# Patient Record
Sex: Female | Born: 1970 | Race: White | Hispanic: No | Smoking: Current every day smoker
Health system: Southern US, Community
[De-identification: ages and names within clinical notes are randomized; demographics above are authoritative.]

## PROBLEM LIST (undated history)

## (undated) DIAGNOSIS — G43909 Migraine, unspecified, not intractable, without status migrainosus: Secondary | ICD-10-CM

## (undated) DIAGNOSIS — E119 Type 2 diabetes mellitus without complications: Secondary | ICD-10-CM

## (undated) DIAGNOSIS — E7800 Pure hypercholesterolemia, unspecified: Secondary | ICD-10-CM

## (undated) DIAGNOSIS — F419 Anxiety disorder, unspecified: Secondary | ICD-10-CM

## (undated) DIAGNOSIS — Z789 Other specified health status: Secondary | ICD-10-CM

## (undated) HISTORY — DX: Migraine, unspecified, not intractable, without status migrainosus: G43.909

---

## 1997-07-26 HISTORY — PX: TUBAL LIGATION: SHX77

## 2016-01-23 ENCOUNTER — Other Ambulatory Visit (HOSPITAL_COMMUNITY): Payer: Self-pay | Admitting: Family

## 2016-01-23 DIAGNOSIS — Z1231 Encounter for screening mammogram for malignant neoplasm of breast: Secondary | ICD-10-CM

## 2016-02-02 ENCOUNTER — Ambulatory Visit (HOSPITAL_COMMUNITY)
Admission: RE | Admit: 2016-02-02 | Discharge: 2016-02-02 | Disposition: A | Discharge status: Home / Self Care | Payer: Self-pay | Source: Ambulatory Visit | Attending: Family | Admitting: Family

## 2016-02-02 DIAGNOSIS — Z1231 Encounter for screening mammogram for malignant neoplasm of breast: Secondary | ICD-10-CM

## 2016-02-06 ENCOUNTER — Other Ambulatory Visit (HOSPITAL_COMMUNITY): Payer: Self-pay | Admitting: Family

## 2016-02-06 ENCOUNTER — Other Ambulatory Visit: Payer: Self-pay | Admitting: Family

## 2016-02-06 DIAGNOSIS — R928 Other abnormal and inconclusive findings on diagnostic imaging of breast: Secondary | ICD-10-CM

## 2016-02-24 ENCOUNTER — Encounter (HOSPITAL_COMMUNITY): Payer: Self-pay

## 2016-03-02 ENCOUNTER — Encounter (HOSPITAL_COMMUNITY): Payer: Self-pay

## 2016-03-09 ENCOUNTER — Ambulatory Visit (HOSPITAL_COMMUNITY)
Admission: RE | Admit: 2016-03-09 | Discharge: 2016-03-09 | Disposition: A | Discharge status: Home / Self Care | Payer: PRIVATE HEALTH INSURANCE | Source: Ambulatory Visit | Attending: Family | Admitting: Family

## 2016-03-09 ENCOUNTER — Encounter (HOSPITAL_COMMUNITY): Payer: PRIVATE HEALTH INSURANCE

## 2016-03-09 DIAGNOSIS — N63 Unspecified lump in breast: Secondary | ICD-10-CM | POA: Insufficient documentation

## 2016-03-09 DIAGNOSIS — R928 Other abnormal and inconclusive findings on diagnostic imaging of breast: Secondary | ICD-10-CM

## 2016-03-09 DIAGNOSIS — N6489 Other specified disorders of breast: Secondary | ICD-10-CM | POA: Diagnosis not present

## 2016-08-30 ENCOUNTER — Other Ambulatory Visit (HOSPITAL_COMMUNITY): Payer: Self-pay | Admitting: *Deleted

## 2016-08-30 DIAGNOSIS — Z09 Encounter for follow-up examination after completed treatment for conditions other than malignant neoplasm: Secondary | ICD-10-CM

## 2016-09-14 ENCOUNTER — Ambulatory Visit (HOSPITAL_COMMUNITY)
Admission: RE | Admit: 2016-09-14 | Discharge: 2016-09-14 | Disposition: A | Discharge status: Home / Self Care | Payer: PRIVATE HEALTH INSURANCE | Source: Ambulatory Visit | Attending: *Deleted | Admitting: *Deleted

## 2016-09-14 DIAGNOSIS — N6311 Unspecified lump in the right breast, upper outer quadrant: Secondary | ICD-10-CM | POA: Diagnosis not present

## 2016-09-14 DIAGNOSIS — Z09 Encounter for follow-up examination after completed treatment for conditions other than malignant neoplasm: Secondary | ICD-10-CM

## 2017-01-28 ENCOUNTER — Other Ambulatory Visit (HOSPITAL_COMMUNITY): Payer: Self-pay | Admitting: *Deleted

## 2017-01-28 DIAGNOSIS — IMO0002 Reserved for concepts with insufficient information to code with codable children: Secondary | ICD-10-CM

## 2017-01-28 DIAGNOSIS — R229 Localized swelling, mass and lump, unspecified: Principal | ICD-10-CM

## 2017-02-15 ENCOUNTER — Encounter (HOSPITAL_COMMUNITY): Payer: Self-pay

## 2017-03-01 ENCOUNTER — Encounter (HOSPITAL_COMMUNITY): Payer: Self-pay

## 2017-03-01 ENCOUNTER — Ambulatory Visit (HOSPITAL_COMMUNITY)
Admission: RE | Admit: 2017-03-01 | Discharge: 2017-03-01 | Disposition: A | Discharge status: Home / Self Care | Payer: PRIVATE HEALTH INSURANCE | Source: Ambulatory Visit | Attending: *Deleted | Admitting: *Deleted

## 2017-03-01 DIAGNOSIS — N6310 Unspecified lump in the right breast, unspecified quadrant: Secondary | ICD-10-CM | POA: Insufficient documentation

## 2017-03-01 DIAGNOSIS — R928 Other abnormal and inconclusive findings on diagnostic imaging of breast: Secondary | ICD-10-CM | POA: Insufficient documentation

## 2017-03-01 DIAGNOSIS — R229 Localized swelling, mass and lump, unspecified: Secondary | ICD-10-CM

## 2017-03-01 DIAGNOSIS — N6320 Unspecified lump in the left breast, unspecified quadrant: Secondary | ICD-10-CM | POA: Insufficient documentation

## 2017-03-01 DIAGNOSIS — IMO0002 Reserved for concepts with insufficient information to code with codable children: Secondary | ICD-10-CM

## 2017-09-01 ENCOUNTER — Other Ambulatory Visit (HOSPITAL_COMMUNITY): Payer: Self-pay | Admitting: *Deleted

## 2017-09-01 DIAGNOSIS — R229 Localized swelling, mass and lump, unspecified: Principal | ICD-10-CM

## 2017-09-01 DIAGNOSIS — IMO0002 Reserved for concepts with insufficient information to code with codable children: Secondary | ICD-10-CM

## 2017-09-01 DIAGNOSIS — Z09 Encounter for follow-up examination after completed treatment for conditions other than malignant neoplasm: Secondary | ICD-10-CM

## 2017-09-20 ENCOUNTER — Other Ambulatory Visit (HOSPITAL_COMMUNITY): Payer: Self-pay | Admitting: *Deleted

## 2017-09-20 ENCOUNTER — Ambulatory Visit (HOSPITAL_COMMUNITY)
Admission: RE | Admit: 2017-09-20 | Discharge: 2017-09-20 | Disposition: A | Discharge status: Home / Self Care | Payer: PRIVATE HEALTH INSURANCE | Source: Ambulatory Visit | Attending: *Deleted | Admitting: *Deleted

## 2017-09-20 DIAGNOSIS — IMO0002 Reserved for concepts with insufficient information to code with codable children: Secondary | ICD-10-CM

## 2017-09-20 DIAGNOSIS — R229 Localized swelling, mass and lump, unspecified: Principal | ICD-10-CM

## 2017-09-20 DIAGNOSIS — N6320 Unspecified lump in the left breast, unspecified quadrant: Secondary | ICD-10-CM

## 2017-09-20 DIAGNOSIS — Z09 Encounter for follow-up examination after completed treatment for conditions other than malignant neoplasm: Secondary | ICD-10-CM

## 2017-09-20 DIAGNOSIS — N6310 Unspecified lump in the right breast, unspecified quadrant: Secondary | ICD-10-CM | POA: Insufficient documentation

## 2017-09-20 HISTORY — DX: Unspecified lump in the left breast, unspecified quadrant: N63.20

## 2018-03-02 ENCOUNTER — Other Ambulatory Visit (HOSPITAL_COMMUNITY): Payer: Self-pay | Admitting: *Deleted

## 2018-03-02 DIAGNOSIS — N6320 Unspecified lump in the left breast, unspecified quadrant: Principal | ICD-10-CM

## 2018-03-02 DIAGNOSIS — N6310 Unspecified lump in the right breast, unspecified quadrant: Secondary | ICD-10-CM

## 2018-03-28 ENCOUNTER — Encounter (HOSPITAL_COMMUNITY): Payer: Self-pay

## 2018-03-28 ENCOUNTER — Ambulatory Visit (HOSPITAL_COMMUNITY): Payer: PRIVATE HEALTH INSURANCE

## 2018-03-28 ENCOUNTER — Ambulatory Visit (HOSPITAL_COMMUNITY): Admission: RE | Admit: 2018-03-28 | Payer: PRIVATE HEALTH INSURANCE | Source: Ambulatory Visit

## 2018-03-28 ENCOUNTER — Other Ambulatory Visit (HOSPITAL_COMMUNITY): Payer: Self-pay | Admitting: *Deleted

## 2018-03-28 DIAGNOSIS — N6320 Unspecified lump in the left breast, unspecified quadrant: Principal | ICD-10-CM

## 2018-03-28 DIAGNOSIS — N6310 Unspecified lump in the right breast, unspecified quadrant: Secondary | ICD-10-CM

## 2018-04-18 ENCOUNTER — Ambulatory Visit (HOSPITAL_COMMUNITY)
Admission: RE | Admit: 2018-04-18 | Discharge: 2018-04-18 | Disposition: A | Discharge status: Home / Self Care | Payer: PRIVATE HEALTH INSURANCE | Source: Ambulatory Visit | Attending: *Deleted | Admitting: *Deleted

## 2018-04-18 DIAGNOSIS — N6322 Unspecified lump in the left breast, upper inner quadrant: Secondary | ICD-10-CM | POA: Diagnosis not present

## 2018-04-18 DIAGNOSIS — N6320 Unspecified lump in the left breast, unspecified quadrant: Secondary | ICD-10-CM

## 2018-04-18 DIAGNOSIS — N6314 Unspecified lump in the right breast, lower inner quadrant: Secondary | ICD-10-CM | POA: Diagnosis not present

## 2018-04-18 DIAGNOSIS — N6310 Unspecified lump in the right breast, unspecified quadrant: Secondary | ICD-10-CM

## 2018-09-21 ENCOUNTER — Other Ambulatory Visit (HOSPITAL_COMMUNITY): Payer: Self-pay | Admitting: *Deleted

## 2018-09-21 DIAGNOSIS — Z09 Encounter for follow-up examination after completed treatment for conditions other than malignant neoplasm: Secondary | ICD-10-CM

## 2018-09-21 DIAGNOSIS — N6320 Unspecified lump in the left breast, unspecified quadrant: Secondary | ICD-10-CM

## 2018-10-03 ENCOUNTER — Ambulatory Visit (HOSPITAL_COMMUNITY)
Admission: RE | Admit: 2018-10-03 | Discharge: 2018-10-03 | Disposition: A | Discharge status: Home / Self Care | Payer: PRIVATE HEALTH INSURANCE | Source: Ambulatory Visit | Attending: *Deleted | Admitting: *Deleted

## 2018-10-03 ENCOUNTER — Encounter (HOSPITAL_COMMUNITY): Payer: Self-pay

## 2018-10-03 ENCOUNTER — Ambulatory Visit (HOSPITAL_COMMUNITY): Payer: PRIVATE HEALTH INSURANCE

## 2018-10-03 ENCOUNTER — Ambulatory Visit (HOSPITAL_COMMUNITY): Payer: Self-pay

## 2018-10-03 DIAGNOSIS — Z09 Encounter for follow-up examination after completed treatment for conditions other than malignant neoplasm: Secondary | ICD-10-CM

## 2018-10-03 DIAGNOSIS — N6320 Unspecified lump in the left breast, unspecified quadrant: Secondary | ICD-10-CM | POA: Insufficient documentation

## 2019-10-16 ENCOUNTER — Other Ambulatory Visit (HOSPITAL_COMMUNITY): Payer: Self-pay | Admitting: *Deleted

## 2019-10-16 DIAGNOSIS — R928 Other abnormal and inconclusive findings on diagnostic imaging of breast: Secondary | ICD-10-CM

## 2019-10-16 DIAGNOSIS — Z09 Encounter for follow-up examination after completed treatment for conditions other than malignant neoplasm: Secondary | ICD-10-CM

## 2019-10-30 ENCOUNTER — Ambulatory Visit (HOSPITAL_COMMUNITY)
Admission: RE | Admit: 2019-10-30 | Discharge: 2019-10-30 | Disposition: A | Discharge status: Home / Self Care | Payer: PRIVATE HEALTH INSURANCE | Source: Ambulatory Visit | Attending: *Deleted | Admitting: *Deleted

## 2019-10-30 ENCOUNTER — Other Ambulatory Visit: Payer: Self-pay

## 2019-10-30 DIAGNOSIS — Z09 Encounter for follow-up examination after completed treatment for conditions other than malignant neoplasm: Secondary | ICD-10-CM | POA: Diagnosis present

## 2019-10-30 DIAGNOSIS — R928 Other abnormal and inconclusive findings on diagnostic imaging of breast: Secondary | ICD-10-CM | POA: Diagnosis present

## 2019-10-30 LAB — HM MAMMOGRAPHY

## 2019-11-06 ENCOUNTER — Other Ambulatory Visit (HOSPITAL_COMMUNITY): Payer: Self-pay

## 2019-11-06 ENCOUNTER — Encounter (HOSPITAL_COMMUNITY): Payer: Self-pay

## 2020-12-08 ENCOUNTER — Other Ambulatory Visit: Payer: Self-pay

## 2020-12-08 ENCOUNTER — Inpatient Hospital Stay (HOSPITAL_COMMUNITY)
Admission: EM | Admit: 2020-12-08 | Discharge: 2020-12-12 | DRG: 641 | Disposition: A | Discharge status: Home / Self Care | Payer: Self-pay | Attending: Family Medicine | Admitting: Family Medicine

## 2020-12-08 ENCOUNTER — Emergency Department (HOSPITAL_COMMUNITY): Payer: Self-pay

## 2020-12-08 ENCOUNTER — Encounter (HOSPITAL_COMMUNITY): Payer: Self-pay | Admitting: *Deleted

## 2020-12-08 DIAGNOSIS — Y92009 Unspecified place in unspecified non-institutional (private) residence as the place of occurrence of the external cause: Secondary | ICD-10-CM

## 2020-12-08 DIAGNOSIS — Z841 Family history of disorders of kidney and ureter: Secondary | ICD-10-CM

## 2020-12-08 DIAGNOSIS — M6282 Rhabdomyolysis: Secondary | ICD-10-CM | POA: Diagnosis present

## 2020-12-08 DIAGNOSIS — Z20822 Contact with and (suspected) exposure to covid-19: Secondary | ICD-10-CM | POA: Diagnosis present

## 2020-12-08 DIAGNOSIS — M419 Scoliosis, unspecified: Secondary | ICD-10-CM | POA: Diagnosis present

## 2020-12-08 DIAGNOSIS — Z833 Family history of diabetes mellitus: Secondary | ICD-10-CM

## 2020-12-08 DIAGNOSIS — F17210 Nicotine dependence, cigarettes, uncomplicated: Secondary | ICD-10-CM | POA: Diagnosis present

## 2020-12-08 DIAGNOSIS — Z880 Allergy status to penicillin: Secondary | ICD-10-CM

## 2020-12-08 DIAGNOSIS — Z28310 Unvaccinated for covid-19: Secondary | ICD-10-CM

## 2020-12-08 DIAGNOSIS — E876 Hypokalemia: Principal | ICD-10-CM | POA: Diagnosis present

## 2020-12-08 DIAGNOSIS — W19XXXA Unspecified fall, initial encounter: Secondary | ICD-10-CM | POA: Diagnosis present

## 2020-12-08 DIAGNOSIS — Z720 Tobacco use: Secondary | ICD-10-CM | POA: Diagnosis present

## 2020-12-08 HISTORY — DX: Other specified health status: Z78.9

## 2020-12-08 LAB — CBC WITH DIFFERENTIAL/PLATELET
Abs Immature Granulocytes: 0.04 10*3/uL (ref 0.00–0.07)
Basophils Absolute: 0.1 10*3/uL (ref 0.0–0.1)
Basophils Relative: 0 %
Eosinophils Absolute: 0.4 10*3/uL (ref 0.0–0.5)
Eosinophils Relative: 3 %
HCT: 36.6 % (ref 36.0–46.0)
Hemoglobin: 11.9 g/dL — ABNORMAL LOW (ref 12.0–15.0)
Immature Granulocytes: 0 %
Lymphocytes Relative: 20 %
Lymphs Abs: 2.8 10*3/uL (ref 0.7–4.0)
MCH: 26.9 pg (ref 26.0–34.0)
MCHC: 32.5 g/dL (ref 30.0–36.0)
MCV: 82.6 fL (ref 80.0–100.0)
Monocytes Absolute: 0.8 10*3/uL (ref 0.1–1.0)
Monocytes Relative: 6 %
Neutro Abs: 10 10*3/uL — ABNORMAL HIGH (ref 1.7–7.7)
Neutrophils Relative %: 71 %
Platelets: 368 10*3/uL (ref 150–400)
RBC: 4.43 MIL/uL (ref 3.87–5.11)
RDW: 14.5 % (ref 11.5–15.5)
WBC: 14.1 10*3/uL — ABNORMAL HIGH (ref 4.0–10.5)
nRBC: 0 % (ref 0.0–0.2)

## 2020-12-08 LAB — COMPREHENSIVE METABOLIC PANEL
ALT: 37 U/L (ref 0–44)
AST: 75 U/L — ABNORMAL HIGH (ref 15–41)
Albumin: 3.4 g/dL — ABNORMAL LOW (ref 3.5–5.0)
Alkaline Phosphatase: 74 U/L (ref 38–126)
Anion gap: 11 (ref 5–15)
BUN: <5 mg/dL — ABNORMAL LOW (ref 6–20)
CO2: 34 mmol/L — ABNORMAL HIGH (ref 22–32)
Calcium: 9.4 mg/dL (ref 8.9–10.3)
Chloride: 90 mmol/L — ABNORMAL LOW (ref 98–111)
Creatinine, Ser: 0.88 mg/dL (ref 0.44–1.00)
GFR, Estimated: >60 mL/min (ref >60)
Glucose, Bld: 161 mg/dL — ABNORMAL HIGH (ref 70–99)
Potassium: <2 mmol/L — CL (ref 3.5–5.1)
Sodium: 135 mmol/L (ref 135–145)
Total Bilirubin: 0.7 mg/dL (ref 0.3–1.2)
Total Protein: 7 g/dL (ref 6.5–8.1)

## 2020-12-08 LAB — RESP PANEL BY RT-PCR (FLU A&B, COVID) ARPGX2
Influenza A by PCR: NEGATIVE
Influenza B by PCR: NEGATIVE
SARS Coronavirus 2 by RT PCR: NEGATIVE

## 2020-12-08 LAB — CK: Total CK: 4007 U/L — ABNORMAL HIGH (ref 38–234)

## 2020-12-08 LAB — TROPONIN I (HIGH SENSITIVITY)
Troponin I (High Sensitivity): 28 ng/L — ABNORMAL HIGH (ref <18)
Troponin I (High Sensitivity): 37 ng/L — ABNORMAL HIGH (ref <18)

## 2020-12-08 LAB — I-STAT BETA HCG BLOOD, ED (MC, WL, AP ONLY): I-stat hCG, quantitative: 8.3 m[IU]/mL — ABNORMAL HIGH (ref <5)

## 2020-12-08 LAB — MAGNESIUM: Magnesium: 1.8 mg/dL (ref 1.7–2.4)

## 2020-12-08 LAB — HCG, QUANTITATIVE, PREGNANCY: hCG, Beta Chain, Quant, S: 9 m[IU]/mL — ABNORMAL HIGH (ref <5)

## 2020-12-08 MED ORDER — SODIUM CHLORIDE 0.9 % IV BOLUS
1000.0000 mL | Freq: Once | INTRAVENOUS | Status: AC
  Administered 2020-12-08: 1000 mL via INTRAVENOUS

## 2020-12-08 MED ORDER — MAGNESIUM SULFATE 2 GM/50ML IV SOLN
2.0000 g | Freq: Once | INTRAVENOUS | Status: AC
  Administered 2020-12-08: 2 g via INTRAVENOUS
  Filled 2020-12-08: qty 50

## 2020-12-08 MED ORDER — POTASSIUM CHLORIDE CRYS ER 20 MEQ PO TBCR
40.0000 meq | EXTENDED_RELEASE_TABLET | Freq: Once | ORAL | Status: AC
  Administered 2020-12-08: 40 meq via ORAL
  Filled 2020-12-08: qty 2

## 2020-12-08 MED ORDER — POTASSIUM CHLORIDE 10 MEQ/100ML IV SOLN
10.0000 meq | INTRAVENOUS | Status: AC
  Administered 2020-12-08 – 2020-12-09 (×3): 10 meq via INTRAVENOUS
  Filled 2020-12-08 (×3): qty 100

## 2020-12-08 NOTE — ED Provider Notes (Signed)
[facility] EMERGENCY DEPARTMENT Provider Note   CSN: [id number] Arrival date & time: 12/08/20  1708     History Chief Complaint  Patient presents with  . Leg Pain    Alejandra Riley is a 50 y.o. female.  HPI   50 year old female presents to the emergency department today for evaluation of bilateral leg pain and decreased ability to move her bilateral hands.  States that she started having pain to the bilateral lower extremities about 10 days ago.  Pain located anterior and posteriorly and is worse with movement.  She denies any injuries.  She denies any numbness to the legs.  She further reports that she has been unable to move her fingers and her hands like she normally would.  She did not have any numbness.  She denies any significant neck pain.  She does report a history of scoliosis.  She denies any recent fevers, cough, chest pain, abdominal pain.  She denies any nausea or vomiting.  She does report that she has had intermittent diarrhea for the last couple months but none recently.  She denies any urinary symptoms.  She denies any drug use, she denies any alcohol use.  She does not take any medications.  History reviewed. No pertinent past medical history.  Patient Active Problem List   Diagnosis Date Noted  . Acute hypokalemia 12/08/2020    History reviewed. No pertinent surgical history.   OB History   No obstetric history on file.     History reviewed. No pertinent family history.  Social History   Tobacco Use  . Smoking status: Current Every Day Smoker    Types: Cigarettes  . Smokeless tobacco: Never Used  Substance Use Topics  . Alcohol use: Not Currently  . Drug use: Never    Home Medications Prior to Admission medications   Not on File    Allergies    Penicillins  Review of Systems   Review of Systems  Constitutional: Negative for fever.  HENT: Negative for ear pain and sore throat.   Eyes: Negative for visual disturbance.  Respiratory:  Negative for cough and shortness of breath.   Cardiovascular: Negative for chest pain.  Gastrointestinal: Positive for diarrhea. Negative for abdominal pain, constipation, nausea and vomiting.  Genitourinary: Negative for dysuria and hematuria.  Musculoskeletal: Positive for myalgias.  Skin: Negative for rash.  Neurological: Positive for weakness. Negative for numbness.  All other systems reviewed and are negative.   Physical Exam Updated Vital Signs BP 113/74   Pulse (!) 54   Temp 98.4 F (36.9 C) (Oral)   Resp 17   Ht 5\' 6"  (1.676 m)   Wt 73.5 kg   LMP 05/20/2017 Comment: tubal ligation  SpO2 99%   BMI 26.15 kg/m   Physical Exam Vitals and nursing note reviewed.  Constitutional:      General: She is not in acute distress.    Appearance: She is well-developed.  HENT:     Head: Normocephalic and atraumatic.  Eyes:     Conjunctiva/sclera: Conjunctivae normal.  Cardiovascular:     Rate and Rhythm: Normal rate and regular rhythm.     Heart sounds: Normal heart sounds. No murmur heard.   Pulmonary:     Effort: Pulmonary effort is normal. No respiratory distress.     Breath sounds: Normal breath sounds. No wheezing, rhonchi or rales.  Abdominal:     General: Bowel sounds are normal.     Palpations: Abdomen is soft.     Tenderness:  There is no abdominal tenderness. There is no guarding or rebound.  Musculoskeletal:     Cervical back: Neck supple.     Right lower leg: No edema.     Left lower leg: No edema.     Comments: Diffuse tenderness to the bilateral lower extremities.  There is no swelling or erythema noted.  DP pulses are strong bilaterally.  Skin:    General: Skin is warm and dry.  Neurological:     Mental Status: She is alert.     Comments: Cranial nerves II through XII intact.  Decreased grip strength bilaterally.  The remainder of the strength in the bilateral upper extremities is intact and sensation is intact.  There is some decrease strength in the  bilateral lower extremities as she has significant pain with movement of the legs.  This limits exam.  She has normal sensation.     ED Results / Procedures / Treatments   Labs (all labs ordered are listed, but only abnormal results are displayed) Labs Reviewed  COMPREHENSIVE METABOLIC PANEL - Abnormal; Notable for the following components:      Result Value   Potassium <2.0 (*)    Chloride 90 (*)    CO2 34 (*)    Glucose, Bld 161 (*)    BUN <5 (*)    Albumin 3.4 (*)    AST 75 (*)    All other components within normal limits  CBC WITH DIFFERENTIAL/PLATELET - Abnormal; Notable for the following components:   WBC 14.1 (*)    Hemoglobin 11.9 (*)    Neutro Abs 10.0 (*)    All other components within normal limits  CK - Abnormal; Notable for the following components:   Total CK 4,007 (*)    All other components within normal limits  HCG, QUANTITATIVE, PREGNANCY - Abnormal; Notable for the following components:   hCG, Beta Chain, Quant, S 9 (*)    All other components within normal limits  I-STAT BETA HCG BLOOD, ED ([facility], [facility], [facility] ONLY) - Abnormal; Notable for the following components:   I-stat hCG, quantitative 8.3 (*)    All other components within normal limits  TROPONIN I (HIGH SENSITIVITY) - Abnormal; Notable for the following components:   Troponin I (High Sensitivity) 28 (*)    All other components within normal limits  RESP PANEL BY RT-PCR (FLU A&B, COVID) ARPGX2  MAGNESIUM  RAPID URINE DRUG SCREEN, HOSP PERFORMED  I-STAT CHEM 8, ED    EKG None  Radiology DG Knee Complete 4 Views Left  Result Date: 12/08/2020 CLINICAL DATA:  Anterior knee pain after fall EXAM: LEFT KNEE - COMPLETE 4+ VIEW COMPARISON:  None. FINDINGS: No evidence of fracture, dislocation, or joint effusion. No evidence of arthropathy or other focal bone abnormality. Soft tissues are unremarkable. IMPRESSION: No acute osseous abnormality. Electronically Signed   By: Maudry Mayhew MD   On: 12/08/2020 21:13     Procedures Procedures   CRITICAL CARE Performed by: Karrie Meres   Total critical care time: 41 minutes  Critical care time was exclusive of separately billable procedures and treating other patients.  Critical care was necessary to treat or prevent imminent or life-threatening deterioration.  Critical care was time spent personally by me on the following activities: development of treatment plan with patient and/or surrogate as well as nursing, discussions with consultants, evaluation of patient's response to treatment, examination of patient, obtaining history from patient or surrogate, ordering and performing treatments and interventions, ordering and review  of laboratory studies, ordering and review of radiographic studies, pulse oximetry and re-evaluation of patient's condition.   Medications Ordered in ED Medications  potassium chloride 10 mEq in 100 mL IVPB (10 mEq Intravenous New Bag/Given 12/08/20 2148)  magnesium sulfate IVPB 2 g 50 mL (2 g Intravenous New Bag/Given 12/08/20 2148)  potassium chloride SA (KLOR-CON) CR tablet 40 mEq (40 mEq Oral Given 12/08/20 2149)  sodium chloride 0.9 % bolus 1,000 mL (1,000 mLs Intravenous New Bag/Given 12/08/20 2146)    ED Course  I have reviewed the triage vital signs and the nursing notes.  Pertinent labs & imaging results that were available during my care of the patient were reviewed by me and considered in my medical decision making (see chart for details).    MDM Rules/Calculators/A&P                          50 year old female presenting for evaluation of decreased strength and pain to the bilateral lower extremities.  Reviewed/interpreted labs CBC shows a mild leukocytosis, mild anemia CMP with hypokalemia with a potassium of less than 2, low chloride, elevated CO2 at 34, normal BUN/creatinine, mildly elevated AST, ALT and bilirubin normal CK is elevated greater than 4000 Magnesium negative Beta-hCG is marginally  positive, suspect that this is a false positive Beta quant marginally elevated - post menopausal Troponin marginally elevated  UDS pending on admission   Patient had a fall in the emergency department.  This was witnessed by myself.  She did not sustain any head trauma, she states she tripped over her flip-flop.  She injured her left knee.  She has some tenderness to palpation on exam.  X-ray does not show any evidence of fracture.  She is ambulatory following the fall.  Patient was given p.o. and IV potassium for critically low potassium.  She is also given IV fluids in the setting of her rhabdomyolysis.  This is nontraumatic.  Unclear cause of her hypokalemia at this time as her only report is mild intermittent diarrhea.  Will admit for further treatment  10:18 PM CONSULT with Dr. Carren Rang with hospitalist service who accepts patient for admission   Final Clinical Impression(s) / ED Diagnoses Final diagnoses:  Hypokalemia  Non-traumatic rhabdomyolysis    Rx / DC Orders ED Discharge Orders    None       Rayne Du 12/08/20 2222    Mancel Bale, MD 12/08/20 2353

## 2020-12-08 NOTE — ED Triage Notes (Signed)
Pt with bilateral upper leg pain since 11/30/20.  Pt with last 3 fingers feel numb to left hand x 4 days and last 2 fingers on right hand starting to feel numb.

## 2020-12-08 NOTE — ED Notes (Signed)
Critical potassium of less than 2.0 reported to edp Courtey Couture PA at this time. See orders

## 2020-12-08 NOTE — ED Notes (Signed)
Around 2020 pt was ambulating without assistance when she tripped over her flip flop and fell. She states she did not fall due to  Light headedness. Does have abrasion to left knee. edp witnessed fall.

## 2020-12-08 NOTE — ED Provider Notes (Signed)
Emergency Medicine Provider Triage Evaluation Note  Alejandra Riley , a 50 y.o. female  was evaluated in triage.  Pt complains of pain and weakness in her extremities.  About 1 week ago she states she started feeling pain in the bilateral upper legs.  Pain worsens with ambulation and when moving to standing from a sitting position.  Also reports mild weakness in the upper leg since her pain started.  Additionally began noticing bilateral elbow pain.  Now reports a feeling of decreased sensation and weakness in the forearms and hands bilaterally.  She states she feels that her left hand is weaker than her right.  She states that the third fourth and fifth digit of the left hand feel weak and she is having difficulty with flexion and extension.  She is beginning to feel the same sensation in the fourth and fifth digit of the right hand.  Physical Exam  BP 130/85 (BP Location: Right Arm)   Pulse 71   Temp 98.4 F (36.9 C) (Oral)   Resp 14   Ht 5\' 6"  (1.676 m)   Wt 73.5 kg   LMP 05/20/2017 Comment: tubal ligation  SpO2 99%   BMI 26.15 kg/m  Gen:   Awake, no distress   Resp:  Normal effort  MSK:   Diminished grip strength bilaterally.  Negative Tinel's sign. Other:    Medical Decision Making  Medically screening exam initiated at 5:54 PM.  Appropriate orders placed.  Krishika Bugge was informed that the remainder of the evaluation will be completed by another provider, this initial triage assessment does not replace that evaluation, and the importance of remaining in the ED until their evaluation is complete.    [name], PA-C 12/08/20 1756    12/10/20, MD 12/08/20 [phone]

## 2020-12-09 ENCOUNTER — Encounter (HOSPITAL_COMMUNITY): Payer: Self-pay | Admitting: Family Medicine

## 2020-12-09 DIAGNOSIS — E876 Hypokalemia: Principal | ICD-10-CM

## 2020-12-09 DIAGNOSIS — M6282 Rhabdomyolysis: Secondary | ICD-10-CM | POA: Diagnosis present

## 2020-12-09 LAB — CBC
HCT: 32.1 % — ABNORMAL LOW (ref 36.0–46.0)
Hemoglobin: 10.4 g/dL — ABNORMAL LOW (ref 12.0–15.0)
MCH: 26.5 pg (ref 26.0–34.0)
MCHC: 32.4 g/dL (ref 30.0–36.0)
MCV: 81.9 fL (ref 80.0–100.0)
Platelets: 318 10*3/uL (ref 150–400)
RBC: 3.92 MIL/uL (ref 3.87–5.11)
RDW: 14.7 % (ref 11.5–15.5)
WBC: 11.6 10*3/uL — ABNORMAL HIGH (ref 4.0–10.5)
nRBC: 0 % (ref 0.0–0.2)

## 2020-12-09 LAB — RAPID URINE DRUG SCREEN, HOSP PERFORMED
Amphetamines: NOT DETECTED
Barbiturates: NOT DETECTED
Benzodiazepines: NOT DETECTED
Cocaine: NOT DETECTED
Opiates: NOT DETECTED
Tetrahydrocannabinol: NOT DETECTED

## 2020-12-09 LAB — COMPREHENSIVE METABOLIC PANEL
ALT: 33 U/L (ref 0–44)
AST: 68 U/L — ABNORMAL HIGH (ref 15–41)
Albumin: 2.7 g/dL — ABNORMAL LOW (ref 3.5–5.0)
Alkaline Phosphatase: 63 U/L (ref 38–126)
Anion gap: 10 (ref 5–15)
BUN: <5 mg/dL — ABNORMAL LOW (ref 6–20)
CO2: 34 mmol/L — ABNORMAL HIGH (ref 22–32)
Calcium: 8.1 mg/dL — ABNORMAL LOW (ref 8.9–10.3)
Chloride: 97 mmol/L — ABNORMAL LOW (ref 98–111)
Creatinine, Ser: 0.69 mg/dL (ref 0.44–1.00)
GFR, Estimated: >60 mL/min (ref >60)
Glucose, Bld: 126 mg/dL — ABNORMAL HIGH (ref 70–99)
Potassium: <2 mmol/L — CL (ref 3.5–5.1)
Sodium: 141 mmol/L (ref 135–145)
Total Bilirubin: 0.7 mg/dL (ref 0.3–1.2)
Total Protein: 5.6 g/dL — ABNORMAL LOW (ref 6.5–8.1)

## 2020-12-09 LAB — NA AND K (SODIUM & POTASSIUM), RAND UR
Potassium Urine: 6 mmol/L
Sodium, Ur: 27 mmol/L

## 2020-12-09 LAB — HIV ANTIBODY (ROUTINE TESTING W REFLEX): HIV Screen 4th Generation wRfx: NONREACTIVE

## 2020-12-09 LAB — TSH: TSH: 1.869 u[IU]/mL (ref 0.350–4.500)

## 2020-12-09 LAB — URINALYSIS, ROUTINE W REFLEX MICROSCOPIC
Bilirubin Urine: NEGATIVE
Glucose, UA: NEGATIVE mg/dL
Hgb urine dipstick: NEGATIVE
Ketones, ur: NEGATIVE mg/dL
Leukocytes,Ua: NEGATIVE
Nitrite: NEGATIVE
Protein, ur: NEGATIVE mg/dL
Specific Gravity, Urine: 1.003 — ABNORMAL LOW (ref 1.005–1.030)
pH: 7 (ref 5.0–8.0)

## 2020-12-09 LAB — OSMOLALITY, URINE: Osmolality, Ur: 137 mOsm/kg — ABNORMAL LOW (ref 300–900)

## 2020-12-09 LAB — CORTISOL: Cortisol, Plasma: 5.4 ug/dL

## 2020-12-09 LAB — POTASSIUM: Potassium: <2 mmol/L — CL (ref 3.5–5.1)

## 2020-12-09 LAB — TROPONIN I (HIGH SENSITIVITY): Troponin I (High Sensitivity): 33 ng/L — ABNORMAL HIGH (ref <18)

## 2020-12-09 LAB — CK: Total CK: 3609 U/L — ABNORMAL HIGH (ref 38–234)

## 2020-12-09 LAB — MAGNESIUM: Magnesium: 2.1 mg/dL (ref 1.7–2.4)

## 2020-12-09 MED ORDER — OXYCODONE HCL 5 MG PO TABS: 5.0000 mg | ORAL_TABLET | ORAL | Status: DC | PRN

## 2020-12-09 MED ORDER — ACETAMINOPHEN 650 MG RE SUPP: 650.0000 mg | Freq: Four times a day (QID) | RECTAL | Status: DC | PRN

## 2020-12-09 MED ORDER — ONDANSETRON HCL 4 MG PO TABS: 4.0000 mg | ORAL_TABLET | Freq: Four times a day (QID) | ORAL | Status: DC | PRN

## 2020-12-09 MED ORDER — ACETAMINOPHEN 325 MG PO TABS
650.0000 mg | ORAL_TABLET | Freq: Four times a day (QID) | ORAL | Status: DC | PRN
  Administered 2020-12-10 – 2020-12-11 (×2): 650 mg via ORAL
  Filled 2020-12-09 (×2): qty 2

## 2020-12-09 MED ORDER — MAGNESIUM SULFATE 2 GM/50ML IV SOLN
2.0000 g | Freq: Once | INTRAVENOUS | Status: AC
  Administered 2020-12-09: 2 g via INTRAVENOUS
  Filled 2020-12-09: qty 50

## 2020-12-09 MED ORDER — SODIUM CHLORIDE 0.9 % IV SOLN: INTRAVENOUS | Status: DC

## 2020-12-09 MED ORDER — POTASSIUM CHLORIDE 10 MEQ/100ML IV SOLN
10.0000 meq | INTRAVENOUS | Status: AC
  Administered 2020-12-09 (×4): 10 meq via INTRAVENOUS
  Filled 2020-12-09 (×4): qty 100

## 2020-12-09 MED ORDER — NICOTINE 21 MG/24HR TD PT24
21.0000 mg | MEDICATED_PATCH | Freq: Every day | TRANSDERMAL | Status: DC
  Administered 2020-12-09: 21 mg via TRANSDERMAL
  Filled 2020-12-09 (×2): qty 1

## 2020-12-09 MED ORDER — ONDANSETRON HCL 4 MG/2ML IJ SOLN: 4.0000 mg | Freq: Four times a day (QID) | INTRAMUSCULAR | Status: DC | PRN

## 2020-12-09 MED ORDER — POTASSIUM CHLORIDE CRYS ER 20 MEQ PO TBCR
40.0000 meq | EXTENDED_RELEASE_TABLET | ORAL | Status: AC
  Administered 2020-12-09 – 2020-12-10 (×3): 40 meq via ORAL
  Filled 2020-12-09 (×3): qty 2

## 2020-12-09 MED ORDER — POTASSIUM CHLORIDE IN NACL 40-0.9 MEQ/L-% IV SOLN: INTRAVENOUS | Status: DC

## 2020-12-09 MED ORDER — HEPARIN SODIUM (PORCINE) 5000 UNIT/ML IJ SOLN
5000.0000 [IU] | Freq: Three times a day (TID) | INTRAMUSCULAR | Status: DC
  Administered 2020-12-09 – 2020-12-11 (×9): 5000 [IU] via SUBCUTANEOUS
  Filled 2020-12-09 (×9): qty 1

## 2020-12-09 MED ORDER — POTASSIUM CHLORIDE CRYS ER 20 MEQ PO TBCR
40.0000 meq | EXTENDED_RELEASE_TABLET | ORAL | Status: AC
  Administered 2020-12-09 (×2): 40 meq via ORAL
  Filled 2020-12-09 (×2): qty 2

## 2020-12-09 NOTE — H&P (Signed)
TRH H&P    Patient Demographics:    Alejandra Riley, is a 50 y.o. female  MRN: [id number]  DOB - Dec 22, 1970  Admit Date - 12/08/2020  Referring MD/NP/PA: Samson Frederic  Outpatient Primary MD for the patient is House, Eugenio Hoes, FNP  Patient coming from: Home  Chief complaint- weakness   HPI:    Alejandra Riley  is a 50 y.o. female, with no known medical history presents the ED with a chief complaint of weakness.  Patient reports that she has had weakness in her hands and legs, contractures in her hands, muscle cramping in her legs, for 4 days.  It is worse in the morning.  Its present in both arms but worse on the left than the right.  She describes the pain in her legs as an aching, like a muscle ache.  Pain starts in her knee and shoots up towards her hip.  It is not radiating from her back.  She reports that this pain actually started before the weakness, approximately 8 days ago.  She reports that she has been treated for hypokalemia in the past but never hospitalized for it.  She has no personal history of kidney disease, reports that her mother had kidney disease.  She has no personal history of any endocrine disorder, but reports her mother did have diabetes.  Patient reports urinary frequency.  She denies any concerns with her blood pressure.  She denies any nausea, vomiting, diarrhea.  She does report that every other day she had 1 loose stool, and then on the office today she has 1 formed stool.  In review of systems she does admit that she was dizzy once 2 to 3 months ago, it was not accompanied by palpitations or chest pain.  Patient does smoke, does not use illicit drugs, does not drink alcohol, is not vaccinated for COVID, patient is full code.  In the ED Temperature 98.4, heart rate 58-71, respiratory rate 14-24, blood pressure 129/78, satting at 98% Initial troponin is 28, and 37 Leukocytosis with a white  blood cell count of 14.1, chemistry shows a potassium of less than 2, chloride 90, bicarb 34, albumin 3.4, AST 75 Initial EKG is of poor quality, repeat pending 40 mEq of potassium given p.o., 3 g of potassium given IV in the ED, magnesium and IV fluids also given in the ED Patient is found to have a CPK of 4007 Admission requested for further work-up of rhabdo and hypokalemia    Review of systems:    In addition to the HPI above,  No Fever-chills, No Headache, No changes with Vision or hearing, No problems swallowing food or Liquids, No Chest pain, Cough or Shortness of Breath, No Abdominal pain, No Nausea or Vomiting, bowel movements are regular, No Blood in stool or Urine, No dysuria, No new skin rashes or bruises, No new weakness, tingling, numbness in any extremity, No recent weight gain or loss, No polyuria, polydypsia or polyphagia, No significant Mental Stressors.  All other systems reviewed and are negative.    Past History  of the following :    Past Medical History:  Diagnosis Date  . Medical history non-contributory       Past Surgical History:  Procedure Laterality Date  . TUBAL LIGATION        Social History:      Social History   Tobacco Use  . Smoking status: Current Every Day Smoker    Packs/day: 1.50    Types: Cigarettes  . Smokeless tobacco: Never Used  Substance Use Topics  . Alcohol use: Not Currently       Family History :    History reviewed. No pertinent family history. Mother had kidney disease and diabetes mellitus type 2   Home Medications:   Prior to Admission medications   Not on File     Allergies:     Allergies  Allergen Reactions  . Amoxicillin Hives  . Penicillins      Physical Exam:   Vitals  Blood pressure 121/76, pulse (!) 55, temperature 98 F (36.7 C), temperature source Oral, resp. rate 18, height  (1.676 m), weight 73.5 kg, last menstrual period 05/20/2017, SpO2 97 %.  1.  General: Patient  lying supine in bed in no acute distress  2. Psychiatric: Mood and behavior normal for situation, memory intact, alert and oriented x3  3. Neurologic: Speech and language are normal, face is symmetric, moves all 4 extremities voluntarily, no focal deficits on limited exam, patient does have generalized weakness  4. HEENMT:  Head is atraumatic, normocephalic, pupils are reactive to light, neck is supple, trachea is midline, mucous membranes are moist  5. Respiratory : Lungs are clear to auscultation bilaterally without wheezes, rhonchi, rales, no cyanosis, no clubbing  6. Cardiovascular : Heart rate is normal, rhythm is regular, no murmurs rubs or gallops, peripheral edema present  7. Gastrointestinal:  Abdomen is soft, nondistended, nontender to palpation, bowel sounds normal  8. Skin:  Skin is warm dry and intact without acute lesion on limited exam  9.Musculoskeletal:   no calf tenderness, no acute deformity, no pitting edema   Data Review:    CBC Recent Labs  Lab 12/08/20 1812  WBC 14.1*  HGB 11.9*  HCT 36.6  PLT 368  MCV 82.6  MCH 26.9  MCHC 32.5  RDW 14.5  LYMPHSABS 2.8  MONOABS 0.8  EOSABS 0.4  BASOSABS 0.1   ------------------------------------------------------------------------------------------------------------------  Results for orders placed or performed during the hospital encounter of 12/08/20 (from the past 48 hour(s))  Comprehensive metabolic panel     Status: Abnormal   Collection Time: 12/08/20  6:12 PM  Result Value Ref Range   Sodium 135 135 - 145 mmol/L   Potassium <2.0 (LL) 3.5 - 5.1 mmol/L    Comment: CRITICAL RESULT CALLED TO, READ BACK BY AND VERIFIED WITH: [place],K ON 12/08/20 AT 2055 BY LOY,C    Chloride 90 (L) 98 - 111 mmol/L   CO2 34 (H) 22 - 32 mmol/L   Glucose, Bld 161 (H) 70 - 99 mg/dL    Comment: Glucose reference range applies only to samples taken after fasting for at least 8 hours.   BUN <5 (L) 6 - 20 mg/dL    Creatinine, Ser 0.98 0.44 - 1.00 mg/dL   Calcium 9.4 8.9 - 11.9 mg/dL   Total Protein 7.0 6.5 - 8.1 g/dL   Albumin 3.4 (L) 3.5 - 5.0 g/dL   AST 75 (H) 15 - 41 U/L   ALT 37 0 - 44 U/L   Alkaline Phosphatase 74  38 - 126 U/L   Total Bilirubin 0.7 0.3 - 1.2 mg/dL   GFR, Estimated >16 >38 mL/min    Comment: (NOTE) Calculated using the CKD-EPI Creatinine Equation (2021)    Anion gap 11 5 - 15    Comment: Performed at [facility], [address] [place], [place] [address]  CBC with Differential     Status: Abnormal   Collection Time: 12/08/20  6:12 PM  Result Value Ref Range   WBC 14.1 (H) 4.0 - 10.5 K/uL   RBC 4.43 3.87 - 5.11 MIL/uL   Hemoglobin 11.9 (L) 12.0 - 15.0 g/dL   HCT 93.5 70.1 - 77.9 %   MCV 82.6 80.0 - 100.0 fL   MCH 26.9 26.0 - 34.0 pg   MCHC 32.5 30.0 - 36.0 g/dL   RDW 39.0 30.0 - 92.3 %   Platelets 368 150 - 400 K/uL   nRBC 0.0 0.0 - 0.2 %   Neutrophils Relative % 71 %   Neutro Abs 10.0 (H) 1.7 - 7.7 K/uL   Lymphocytes Relative 20 %   Lymphs Abs 2.8 0.7 - 4.0 K/uL   Monocytes Relative 6 %   Monocytes Absolute 0.8 0.1 - 1.0 K/uL   Eosinophils Relative 3 %   Eosinophils Absolute 0.4 0.0 - 0.5 K/uL   Basophils Relative 0 %   Basophils Absolute 0.1 0.0 - 0.1 K/uL   Immature Granulocytes 0 %   Abs Immature Granulocytes 0.04 0.00 - 0.07 K/uL    Comment: Performed at [facility], [address] [place], [place] [address]  CK     Status: Abnormal   Collection Time: 12/08/20  8:30 PM  Result Value Ref Range   Total CK 4,007 (H) 38 - 234 U/L    Comment: Performed at [facility], [address], [place], [place] [address]  Magnesium     Status: None   Collection Time: 12/08/20  8:30 PM  Result Value Ref Range   Magnesium 1.8 1.7 - 2.4 mg/dL    Comment: Performed at [facility], [address], [place], [place] [address]  I-Stat Beta hCG blood, ED ([facility], [facility], [facility] only)     Status: Abnormal   Collection Time: 12/08/20  8:33 PM  Result Value Ref Range   I-stat hCG,  quantitative 8.3 (H) <5 mIU/mL   Comment 3            Comment:   GEST. AGE      CONC.  (mIU/mL)   <=1 WEEK        5 - 50     2 WEEKS       50 - 500     3 WEEKS       100 - 10,000     4 WEEKS     1,000 - 30,000        FEMALE AND NON-PREGNANT FEMALE:     LESS THAN 5 mIU/mL   Troponin I (High Sensitivity)     Status: Abnormal   Collection Time: 12/08/20  9:07 PM  Result Value Ref Range   Troponin I (High Sensitivity) 28 (H) <18 ng/L    Comment: (NOTE) Elevated high sensitivity troponin I (hsTnI) values and significant  changes across serial measurements may suggest ACS but many other  chronic and acute conditions are known to elevate hsTnI results.  Refer to the "Links" section for chest pain algorithms and additional  guidance. Performed at [facility], [address], [place], [place] [address]  hCG, quantitative, pregnancy     Status: Abnormal   Collection Time: 12/08/20  9:08 PM  Result Value Ref Range   hCG, Beta Chain, Quant, S 9 (H) <5 mIU/mL    Comment:          GEST. AGE      CONC.  (mIU/mL)   <=1 WEEK        5 - 50     2 WEEKS       50 - 500     3 WEEKS       100 - 10,000     4 WEEKS     1,000 - 30,000     5 WEEKS     3,500 - 115,000   6-8 WEEKS     12,000 - 270,000    12 WEEKS     15,000 - 220,000        FEMALE AND NON-PREGNANT FEMALE:     LESS THAN 5 mIU/mL Performed at [facility], [address] [place], [place] [address]   Resp Panel by RT-PCR (Flu A&B, Covid) Nasopharyngeal Swab     Status: None   Collection Time: 12/08/20  9:54 PM   Specimen: Nasopharyngeal Swab; Nasopharyngeal(NP) swabs in vial transport medium  Result Value Ref Range   SARS Coronavirus 2 by RT PCR NEGATIVE NEGATIVE    Comment: (NOTE) SARS-CoV-2 target nucleic acids are NOT DETECTED.  The SARS-CoV-2 RNA is generally detectable in upper respiratory specimens during the acute phase of infection. The lowest concentration of SARS-CoV-2 viral copies this assay can detect is 138 copies/mL. A  negative result does not preclude SARS-Cov-2 infection and should not be used as the sole basis for treatment or other patient management decisions. A negative result may occur with  improper specimen collection/handling, submission of specimen other than nasopharyngeal swab, presence of viral mutation(s) within the areas targeted by this assay, and inadequate number of viral copies(<138 copies/mL). A negative result must be combined with clinical observations, patient history, and epidemiological information. The expected result is Negative.  Fact Sheet for Patients:  BloggerCourse.comhttps://www.fda.gov/media/152166/download  Fact Sheet for Healthcare Providers:  SeriousBroker.ithttps://www.fda.gov/media/152162/download  This test is no t yet approved or cleared by the [place] FDA and  has been authorized for detection and/or diagnosis of SARS-CoV-2 by FDA under an Emergency Use Authorization (EUA). This EUA will remain  in effect (meaning this test can be used) for the duration of the COVID-19 declaration under Section 564(b)(1) of the Act, 21 U.S.C.section 360bbb-3(b)(1), unless the authorization is terminated  or revoked sooner.       Influenza A by PCR NEGATIVE NEGATIVE   Influenza B by PCR NEGATIVE NEGATIVE    Comment: (NOTE) The Xpert Xpress SARS-CoV-2/FLU/RSV plus assay is intended as an aid in the diagnosis of influenza from Nasopharyngeal swab specimens and should not be used as a sole basis for treatment. Nasal washings and aspirates are unacceptable for Xpert Xpress SARS-CoV-2/FLU/RSV testing.  Fact Sheet for Patients: BloggerCourse.comhttps://www.fda.gov/media/152166/download  Fact Sheet for Healthcare Providers: SeriousBroker.ithttps://www.fda.gov/media/152162/download  This test is not yet approved or cleared by the [place] FDA and has been authorized for detection and/or diagnosis of SARS-CoV-2 by FDA under an Emergency Use Authorization (EUA). This EUA will remain in effect (meaning this test can be used)  for the duration of the COVID-19 declaration under Section 564(b)(1) of the Act, 21 U.S.C. section 360bbb-3(b)(1), unless the authorization is terminated or revoked.  Performed at [facility], [address] [place], [place] [address]  Troponin I (High Sensitivity)     Status: Abnormal   Collection Time: 12/08/20 10:53 PM  Result Value Ref Range   Troponin I (High Sensitivity) 37 (H) <18 ng/L    Comment: (NOTE) Elevated high sensitivity troponin I (hsTnI) values and significant  changes across serial measurements may suggest ACS but many other  chronic and acute conditions are known to elevate hsTnI results.  Refer to the "Links" section for chest pain algorithms and additional  guidance. Performed at [facility], [address], [place], [place] [address]     Chemistries  Recent Labs  Lab 12/08/20 1812 12/08/20 2030  NA 135  --   K <2.0*  --   CL 90*  --   CO2 34*  --   GLUCOSE 161*  --   BUN <5*  --   CREATININE 0.88  --   CALCIUM 9.4  --   MG  --  1.8  AST 75*  --   ALT 37  --   ALKPHOS 74  --   BILITOT 0.7  --    ------------------------------------------------------------------------------------------------------------------  ------------------------------------------------------------------------------------------------------------------ GFR: Estimated Creatinine Clearance: 79.4 mL/min (by C-G formula based on SCr of 0.88 mg/dL). Liver Function Tests: Recent Labs  Lab 12/08/20 1812  AST 75*  ALT 37  ALKPHOS 74  BILITOT 0.7  PROT 7.0  ALBUMIN 3.4*   No results for input(s): LIPASE, AMYLASE in the last 168 hours. No results for input(s): AMMONIA in the last 168 hours. Coagulation Profile: No results for input(s): INR, PROTIME in the last 168 hours. Cardiac Enzymes: Recent Labs  Lab 12/08/20 2030  CKTOTAL 4,007*   BNP (last 3 results) No results for input(s): PROBNP in the last 8760 hours. HbA1C: No results for input(s): HGBA1C in the last 72  hours. CBG: No results for input(s): GLUCAP in the last 168 hours. Lipid Profile: No results for input(s): CHOL, HDL, LDLCALC, TRIG, CHOLHDL, LDLDIRECT in the last 72 hours. Thyroid Function Tests: No results for input(s): TSH, T4TOTAL, FREET4, T3FREE, THYROIDAB in the last 72 hours. Anemia Panel: No results for input(s): VITAMINB12, FOLATE, FERRITIN, TIBC, IRON, RETICCTPCT in the last 72 hours.  --------------------------------------------------------------------------------------------------------------- Urine analysis: No results found for: COLORURINE, APPEARANCEUR, LABSPEC, PHURINE, GLUCOSEU, HGBUR, BILIRUBINUR, KETONESUR, PROTEINUR, UROBILINOGEN, NITRITE, LEUKOCYTESUR    Imaging Results:    DG Knee Complete 4 Views Left  Result Date: 12/08/2020 CLINICAL DATA:  Anterior knee pain after fall EXAM: LEFT KNEE - COMPLETE 4+ VIEW COMPARISON:  None. FINDINGS: No evidence of fracture, dislocation, or joint effusion. No evidence of arthropathy or other focal bone abnormality. Soft tissues are unremarkable. IMPRESSION: No acute osseous abnormality. Electronically Signed   By: Maudry Mayhew MD   On: 12/08/2020 21:13    Repeat EKG pending   Assessment & Plan:    Active Problems:   Acute hypokalemia   1. Severe hypokalemia 1. Replace, recheck, check magnesium, monitor on telemetry 2. Rhabdomyolysis 1. CPK for 4007 2. Continue aggressive hydration 3. Etiology unclear, no periods of immobility 4. UDS pending 5. Trend in the a.m. 3. Leukocytosis 1. Acute phase reactant? 2. Infectious symptoms 3. No respiratory symptoms 4. UA pending 5. Continue to trend in a.m. 4. Tobacco use disorder 1. Counseled on cessation 2. Nicotine patch ordered 5. Elevated troponin 1. Minimally elevated in the 20s and 30s, no chest pain 2. Continue to trend 3. Monitor on telemetry 4. EKG pending 6.    DVT Prophylaxis-   Heparin- SCDs   AM Labs Ordered, also please  review Full Orders  Family  Communication: No family at bedside code Status: Full  Admission status: Inpatient :The appropriate admission status for this patient is INPATIENT. Inpatient status is judged to be reasonable and necessary in order to provide the required intensity of service to ensure the patient's safety. The patient's presenting symptoms, physical exam findings, and initial radiographic and laboratory data in the context of their chronic comorbidities is felt to place them at high risk for further clinical deterioration. Furthermore, it is not anticipated that the patient will be medically stable for discharge from the hospital within 2 midnights of admission. The following factors support the admission status of inpatient.     The patient's presenting symptoms include weakness  the worrisome physical exam findings include sinus arrhythmia on monitor  The chronic co-morbidities include no known medical history     * I certify that at the point of admission it is my clinical judgment that the patient will require inpatient hospital care spanning beyond 2 midnights from the point of admission due to high intensity of service, high risk for further deterioration and high frequency of surveillance required.*  Time spent in minutes : 65    B Zierle-Ghosh do

## 2020-12-09 NOTE — Progress Notes (Signed)
Patient seen and examined.  Admitted after midnight secondary to generalized weakness and mechanical fall resulting in mild traumatic rhabdomyolysis.  Found with severe hypokalemic process.  Patient reports no nausea, no vomiting, no diarrhea and good oral intake prior to admission.  Please refer to H&P written by Dr. Carren Rang on 12/09/20 for further info/details on admission.   Plan: -will continue IVF's, but will add potassium to her fluids  -will provide oral potassium repletion and magnesium  -will check cortisol level and aldosterone/renin ratio -continue supportive care and follow clinical response -no chest pain; will continue telemetry monitoring   [name] MD [phone]

## 2020-12-09 NOTE — Progress Notes (Signed)
Lab called with a critical potassium level of less than 2.0. MD Zierle-Ghosh notified.

## 2020-12-09 NOTE — Progress Notes (Signed)
Lab called critical lab potassium less than 2.0. MD Gwenlyn Perking made aware. New orders given.

## 2020-12-10 DIAGNOSIS — Z720 Tobacco use: Secondary | ICD-10-CM | POA: Diagnosis present

## 2020-12-10 DIAGNOSIS — M6282 Rhabdomyolysis: Secondary | ICD-10-CM | POA: Insufficient documentation

## 2020-12-10 LAB — MAGNESIUM: Magnesium: 1.9 mg/dL (ref 1.7–2.4)

## 2020-12-10 LAB — BASIC METABOLIC PANEL
Anion gap: 8 (ref 5–15)
BUN: <5 mg/dL — ABNORMAL LOW (ref 6–20)
CO2: 31 mmol/L (ref 22–32)
Calcium: 7.4 mg/dL — ABNORMAL LOW (ref 8.9–10.3)
Chloride: 105 mmol/L (ref 98–111)
Creatinine, Ser: 0.6 mg/dL (ref 0.44–1.00)
GFR, Estimated: >60 mL/min (ref >60)
Glucose, Bld: 110 mg/dL — ABNORMAL HIGH (ref 70–99)
Potassium: 3.3 mmol/L — ABNORMAL LOW (ref 3.5–5.1)
Sodium: 144 mmol/L (ref 135–145)

## 2020-12-10 LAB — ALDOSTERONE + RENIN ACTIVITY W/ RATIO

## 2020-12-10 MED ORDER — POTASSIUM CHLORIDE CRYS ER 20 MEQ PO TBCR
40.0000 meq | EXTENDED_RELEASE_TABLET | ORAL | Status: AC
  Administered 2020-12-10 (×2): 40 meq via ORAL
  Filled 2020-12-10 (×2): qty 2

## 2020-12-10 MED ORDER — COSYNTROPIN 0.25 MG IJ SOLR
0.2500 mg | Freq: Once | INTRAMUSCULAR | Status: AC
  Administered 2020-12-11: 0.25 mg via INTRAVENOUS
  Filled 2020-12-10: qty 0.25

## 2020-12-10 MED ORDER — NICOTINE 14 MG/24HR TD PT24
14.0000 mg | MEDICATED_PATCH | Freq: Every day | TRANSDERMAL | Status: DC
  Administered 2020-12-10 – 2020-12-12 (×3): 14 mg via TRANSDERMAL
  Filled 2020-12-10 (×3): qty 1

## 2020-12-10 NOTE — Progress Notes (Signed)
Patient Demographics:    Alejandra Riley, is a 50 y.o. female, DOB - 09/12/1970, KDT:[id number]  Admit date - 12/08/2020   Admitting Physician Lilyan Gilford, DO  Outpatient Primary MD for the patient is House, Alejandra Hoes, FNP  LOS - 2   Chief Complaint  Patient presents with  . Leg Pain        Subjective:    [name] today has no fevers, no emesis,  No chest pain,   -Tolerating oral intake well potassium remains low  Assessment  & Plan :    Principal Problem:   Acute hypokalemia Active Problems:   Tobacco abuse >> 2P/day   Rhabdomyolysis--S/p Fall  Brief Summary:-  50 year old smoker admitted on 12/09/2020 due to status post fall at home with weakness in her hands and legs, contractures in her hands, muscle cramping in her legs several days PTA and found to have persistent, recurrent hypokalemia and elevated CPK levels  A/p 1)Recurrent/Persistent Hypokalemia--a.m. cortisol is low at 5.4, ACTH test is pending, aldosterone/renin ratio test is pending -Serum magnesium is 1.9 -Continue potassium replacements No Nausea, Vomiting or Diarrhea   2)Traumatic Rhabdomyolysis-- s/p fall,  CK 4,007 >> 3,609--- continue to hydrate  3) tobacco abuse--smoking cessation advised okay to use nicotine patch  Disposition/Need for in-Hospital Stay- patient unable to be discharged at this time due to persistent, recurrent hypokalemia requiring IV replacements and further work-up for possible adrenal component*  Status is: Inpatient  Remains inpatient appropriate because:Please see disposition above   Disposition: The patient is from: Home              Anticipated d/c is to: Home              Anticipated d/c date is: 2 days              Patient currently is not medically stable to d/c. Barriers: Not Clinically Stable-   Code Status :  -  Code Status: Full Code   Family Communication:    NA  (patient is alert, awake and coherent)   Consults  :  na  DVT Prophylaxis  :   - SCDs   heparin injection 5,000 Units Start: 12/09/20 0600 SCDs Start: 12/09/20 0205    Lab Results  Component Value Date   PLT 318 12/09/2020    Inpatient Medications  Scheduled Meds: . heparin  5,000 Units Subcutaneous Q8H  . nicotine  14 mg Transdermal Daily  . potassium chloride  40 mEq Oral Q3H   Continuous Infusions: . 0.9 % NaCl with KCl 40 mEq / L 50 mL/hr at 12/10/20 0959   PRN Meds:.acetaminophen **OR** acetaminophen, ondansetron **OR** ondansetron (ZOFRAN) IV, oxyCODONE    Anti-infectives (From admission, onward)   None        Objective:   Vitals:   12/09/20 0220 12/09/20 1419 12/09/20 2029 12/10/20 0532  BP: 121/76 120/72 124/74 114/69  Pulse: (!) 55 60 69 63  Resp: 18 18 18 18   Temp: 98 F (36.7 C) 98.6 F (37 C) 99.1 F (37.3 C) 98.1 F (36.7 C)  TempSrc: Oral Oral Oral Oral  SpO2: 97% 97% 100% 96%  Weight:      Height:        Wt Readings  from Last 3 Encounters:  12/08/20 73.5 kg     Intake/Output Summary (Last 24 hours) at 12/10/2020 1058 Last data filed at 12/10/2020 0900 Gross per 24 hour  Intake 1610.59 ml  Output --  Net 1610.59 ml    Physical Exam  Gen:- Awake Alert,  In no apparent distress  HEENT:- [place].AT, No sclera icterus Neck-Supple Neck,No JVD,.  Lungs-  CTAB , fair symmetrical air movement CV- S1, S2 normal, regular  Abd-  +ve B.Sounds, Abd Soft, No tenderness,    Extremity/Skin:- No  edema, pedal pulses present  Psych-affect is appropriate, oriented x3 Neuro-generalized weakness, no new focal deficits, no tremors   Data Review:   Micro Results Recent Results (from the past 240 hour(s))  Resp Panel by RT-PCR (Flu A&B, Covid) Nasopharyngeal Swab     Status: None   Collection Time: 12/08/20  9:54 PM   Specimen: Nasopharyngeal Swab; Nasopharyngeal(NP) swabs in vial transport medium  Result Value Ref Range Status   SARS Coronavirus 2  by RT PCR NEGATIVE NEGATIVE Final    Comment: (NOTE) SARS-CoV-2 target nucleic acids are NOT DETECTED.  The SARS-CoV-2 RNA is generally detectable in upper respiratory specimens during the acute phase of infection. The lowest concentration of SARS-CoV-2 viral copies this assay can detect is 138 copies/mL. A negative result does not preclude SARS-Cov-2 infection and should not be used as the sole basis for treatment or other patient management decisions. A negative result may occur with  improper specimen collection/handling, submission of specimen other than nasopharyngeal swab, presence of viral mutation(s) within the areas targeted by this assay, and inadequate number of viral copies(<138 copies/mL). A negative result must be combined with clinical observations, patient history, and epidemiological information. The expected result is Negative.  Fact Sheet for Patients:  BloggerCourse.com  Fact Sheet for Healthcare Providers:  SeriousBroker.it  This test is no t yet approved or cleared by the [place] FDA and  has been authorized for detection and/or diagnosis of SARS-CoV-2 by FDA under an Emergency Use Authorization (EUA). This EUA will remain  in effect (meaning this test can be used) for the duration of the COVID-19 declaration under Section 564(b)(1) of the Act, 21 U.S.C.section 360bbb-3(b)(1), unless the authorization is terminated  or revoked sooner.       Influenza A by PCR NEGATIVE NEGATIVE Final   Influenza B by PCR NEGATIVE NEGATIVE Final    Comment: (NOTE) The Xpert Xpress SARS-CoV-2/FLU/RSV plus assay is intended as an aid in the diagnosis of influenza from Nasopharyngeal swab specimens and should not be used as a sole basis for treatment. Nasal washings and aspirates are unacceptable for Xpert Xpress SARS-CoV-2/FLU/RSV testing.  Fact Sheet for Patients: BloggerCourse.com  Fact  Sheet for Healthcare Providers: SeriousBroker.it  This test is not yet approved or cleared by the [place] FDA and has been authorized for detection and/or diagnosis of SARS-CoV-2 by FDA under an Emergency Use Authorization (EUA). This EUA will remain in effect (meaning this test can be used) for the duration of the COVID-19 declaration under Section 564(b)(1) of the Act, 21 U.S.C. section 360bbb-3(b)(1), unless the authorization is terminated or revoked.  Performed at [facility], [address], [place], [place] [address]     Radiology Reports DG Knee Complete 4 Views Left  Result Date: 12/08/2020 CLINICAL DATA:  Anterior knee pain after fall EXAM: LEFT KNEE - COMPLETE 4+ VIEW COMPARISON:  None. FINDINGS: No evidence of fracture, dislocation, or joint effusion. No evidence of arthropathy or other focal  bone abnormality. Soft tissues are unremarkable. IMPRESSION: No acute osseous abnormality. Electronically Signed   By: Maudry Mayhew MD   On: 12/08/2020 21:13     CBC Recent Labs  Lab 12/08/20 1812 12/09/20 0552  WBC 14.1* 11.6*  HGB 11.9* 10.4*  HCT 36.6 32.1*  PLT 368 318  MCV 82.6 81.9  MCH 26.9 26.5  MCHC 32.5 32.4  RDW 14.5 14.7  LYMPHSABS 2.8  --   MONOABS 0.8  --   EOSABS 0.4  --   BASOSABS 0.1  --     Chemistries  Recent Labs  Lab 12/08/20 1812 12/08/20 2030 12/09/20 0552 12/09/20 1338 12/10/20 0545  NA 135  --  141  --  144  K <2.0*  --  <2.0* <2.0* 3.3*  CL 90*  --  97*  --  105  CO2 34*  --  34*  --  31  GLUCOSE 161*  --  126*  --  110*  BUN <5*  --  <5*  --  <5*  CREATININE 0.88  --  0.69  --  0.60  CALCIUM 9.4  --  8.1*  --  7.4*  MG  --  1.8 2.1  --  1.9  AST 75*  --  68*  --   --   ALT 37  --  33  --   --   ALKPHOS 74  --  63  --   --   BILITOT 0.7  --  0.7  --   --    ------------------------------------------------------------------------------------------------------------------ No results for input(s):  CHOL, HDL, LDLCALC, TRIG, CHOLHDL, LDLDIRECT in the last 72 hours.  No results found for: HGBA1C ------------------------------------------------------------------------------------------------------------------ Recent Labs    12/09/20 0615  TSH 1.869   ------------------------------------------------------------------------------------------------------------------ No results for input(s): VITAMINB12, FOLATE, FERRITIN, TIBC, IRON, RETICCTPCT in the last 72 hours.  Coagulation profile No results for input(s): INR, PROTIME in the last 168 hours.  No results for input(s): DDIMER in the last 72 hours.  Cardiac Enzymes No results for input(s): CKMB, TROPONINI, MYOGLOBIN in the last 168 hours.  Invalid input(s): CK ------------------------------------------------------------------------------------------------------------------ No results found for: BNP   Shon Hale M.D on 12/10/2020 at 10:58 AM  Go to [email or web address] - for contact info  [facility] - Office  [phone]

## 2020-12-11 LAB — RENAL FUNCTION PANEL
Albumin: 2.7 g/dL — ABNORMAL LOW (ref 3.5–5.0)
Anion gap: 7 (ref 5–15)
BUN: <5 mg/dL — ABNORMAL LOW (ref 6–20)
CO2: 28 mmol/L (ref 22–32)
Calcium: 7.9 mg/dL — ABNORMAL LOW (ref 8.9–10.3)
Chloride: 106 mmol/L (ref 98–111)
Creatinine, Ser: 0.57 mg/dL (ref 0.44–1.00)
GFR, Estimated: >60 mL/min (ref >60)
Glucose, Bld: 96 mg/dL (ref 70–99)
Phosphorus: 3 mg/dL (ref 2.5–4.6)
Potassium: 3.6 mmol/L (ref 3.5–5.1)
Sodium: 141 mmol/L (ref 135–145)

## 2020-12-11 LAB — ACTH STIMULATION, 3 TIME POINTS
Cortisol, 30 Min: 19.8 ug/dL
Cortisol, 60 Min: 26.5 ug/dL
Cortisol, Base: 9 ug/dL

## 2020-12-11 LAB — CK: Total CK: 5170 U/L — ABNORMAL HIGH (ref 38–234)

## 2020-12-11 LAB — SEDIMENTATION RATE: Sed Rate: 68 mm/hr — ABNORMAL HIGH (ref 0–22)

## 2020-12-11 LAB — C-REACTIVE PROTEIN: CRP: 1.5 mg/dL — ABNORMAL HIGH (ref <1.0)

## 2020-12-11 MED ORDER — SODIUM CHLORIDE 0.9 % IV SOLN: INTRAVENOUS | Status: DC

## 2020-12-11 MED ORDER — LOPERAMIDE HCL 2 MG PO CAPS
2.0000 mg | ORAL_CAPSULE | Freq: Once | ORAL | Status: AC
  Administered 2020-12-11: 2 mg via ORAL
  Filled 2020-12-11: qty 1

## 2020-12-11 NOTE — Progress Notes (Signed)
Patient Demographics:    Alejandra Riley, is a 50 y.o. female, DOB - 12-26-1970, BEM:[id number]  Admit date - 12/08/2020   Admitting Physician Rolla Plate, DO  Outpatient Primary MD for the patient is House, Deliah Goody, FNP  LOS - 3   Chief Complaint  Patient presents with  . Leg Pain        Subjective:    Alejandra Riley today has no fevers, no emesis,  No chest pain,   -Complains of myalgias, had loose stools  Assessment  & Plan :    Principal Problem:   Acute hypokalemia Active Problems:   Non-traumatic rhabdomyolysis   Tobacco abuse >> 2P/day  Brief Summary:-  50 year old smoker admitted on 12/09/2020 due to status post fall at home with weakness in her hands and legs, contractures in her hands, muscle cramping in her legs several days PTA and found to have persistent, recurrent hypokalemia and elevated CPK levels  A/p 1)Recurrent/Persistent Hypokalemia--a.m. cortisol is low at 5.4,  ACTH test on 12/11/20 is WNL (a.m. baseline cortisol 9.0, 30 minutes post injection 19.8, 60 minutes postinjection 26.5) - aldosterone/renin ratio test is pending -Serum magnesium is 1.9 -Continue potassium replacements No Nausea, Vomiting or Diarrhea   2)Non-traumatic rhabdomyolysis/myalgias-- -patient denies any trauma -Patient with proximal muscle weakness/myalgia-elevated CPK makes PMR less likely, elevated CPK makes polymyositis more likely --- CK 4,007 >> 3,609>> 5,170-- -CKs are trending back up ,continue to hydrate -ESR 68, CRP 1.5  3) tobacco abuse--smoking cessation advised okay to use nicotine patch  Disposition/Need for in-Hospital Stay- patient unable to be discharged at this time due to persistent, recurrent hypokalemia requiring IV replacements and further work-up for possible adrenal component*  Status is: Inpatient  Remains inpatient appropriate because:Please see disposition  above   Disposition: The patient is from: Home              Anticipated d/c is to: Home              Anticipated d/c date is: 2 days              Patient currently is not medically stable to d/c. Barriers: Not Clinically Stable-   Code Status :  -  Code Status: Full Code   Family Communication:    NA (patient is alert, awake and coherent)   Consults  :  na  DVT Prophylaxis  :   - SCDs   heparin injection 5,000 Units Start: 12/09/20 0600 SCDs Start: 12/09/20 0205    Lab Results  Component Value Date   PLT 318 12/09/2020    Inpatient Medications  Scheduled Meds: . heparin  5,000 Units Subcutaneous Q8H  . nicotine  14 mg Transdermal Daily   Continuous Infusions: . sodium chloride 150 mL/hr at 12/11/20 0944   PRN Meds:.acetaminophen **OR** acetaminophen, ondansetron **OR** ondansetron (ZOFRAN) IV, oxyCODONE    Anti-infectives (From admission, onward)   None        Objective:   Vitals:   12/10/20 0532 12/10/20 1406 12/10/20 2013 12/11/20 0453  BP: 114/69 115/66 (!) 131/93 (!) 114/56  Pulse: 63 65 82 (!) 58  Resp: _0 Temp: 98.1 F (36.7 C) 98.4 F (36.9 C) 99.3 F (37.4 C) 98.3 F (36.8  C)  TempSrc: Oral Oral  Oral  SpO2: 96% 97% 95% 93%  Weight:      Height:        Wt Readings from Last 3 Encounters:  12/08/20 73.5 kg     Intake/Output Summary (Last 24 hours) at 12/11/2020 1705 Last data filed at 12/11/2020 1500 Gross per 24 hour  Intake 1270 ml  Output --  Net 1270 ml    Physical Exam  Gen:- Awake Alert,  In no apparent distress  HEENT:- [place].AT, No sclera icterus Neck-Supple Neck,No JVD,.  Lungs-  CTAB , fair symmetrical air movement CV- S1, S2 normal, regular  Abd-  +ve B.Sounds, Abd Soft, No tenderness,    Extremity/Skin:- No  edema, pedal pulses present , patient with thigh and hip area muscle weakness and pain Psych-affect is appropriate, oriented x3 Neuro-generalized weakness, no new focal deficits, no tremors   Data  Review:   Micro Results Recent Results (from the past 240 hour(s))  Resp Panel by RT-PCR (Flu A&B, Covid) Nasopharyngeal Swab     Status: None   Collection Time: 12/08/20  9:54 PM   Specimen: Nasopharyngeal Swab; Nasopharyngeal(NP) swabs in vial transport medium  Result Value Ref Range Status   SARS Coronavirus 2 by RT PCR NEGATIVE NEGATIVE Final    Comment: (NOTE) SARS-CoV-2 target nucleic acids are NOT DETECTED.  The SARS-CoV-2 RNA is generally detectable in upper respiratory specimens during the acute phase of infection. The lowest concentration of SARS-CoV-2 viral copies this assay can detect is 138 copies/mL. A negative result does not preclude SARS-Cov-2 infection and should not be used as the sole basis for treatment or other patient management decisions. A negative result may occur with  improper specimen collection/handling, submission of specimen other than nasopharyngeal swab, presence of viral mutation(s) within the areas targeted by this assay, and inadequate number of viral copies(<138 copies/mL). A negative result must be combined with clinical observations, patient history, and epidemiological information. The expected result is Negative.  Fact Sheet for Patients:  EntrepreneurPulse.com.au  Fact Sheet for Healthcare Providers:  IncredibleEmployment.be  This test is no t yet approved or cleared by the [place] FDA and  has been authorized for detection and/or diagnosis of SARS-CoV-2 by FDA under an Emergency Use Authorization (EUA). This EUA will remain  in effect (meaning this test can be used) for the duration of the COVID-19 declaration under Section 564(b)(1) of the Act, 21 U.S.C.section 360bbb-3(b)(1), unless the authorization is terminated  or revoked sooner.       Influenza A by PCR NEGATIVE NEGATIVE Final   Influenza B by PCR NEGATIVE NEGATIVE Final    Comment: (NOTE) The Xpert Xpress SARS-CoV-2/FLU/RSV plus  assay is intended as an aid in the diagnosis of influenza from Nasopharyngeal swab specimens and should not be used as a sole basis for treatment. Nasal washings and aspirates are unacceptable for Xpert Xpress SARS-CoV-2/FLU/RSV testing.  Fact Sheet for Patients: EntrepreneurPulse.com.au  Fact Sheet for Healthcare Providers: IncredibleEmployment.be  This test is not yet approved or cleared by the [place] FDA and has been authorized for detection and/or diagnosis of SARS-CoV-2 by FDA under an Emergency Use Authorization (EUA). This EUA will remain in effect (meaning this test can be used) for the duration of the COVID-19 declaration under Section 564(b)(1) of the Act, 21 U.S.C. section 360bbb-3(b)(1), unless the authorization is terminated or revoked.  Performed at [facility], [address], [place], [place] [address]     Radiology Reports DG Knee Complete 4  Views Left  Result Date: 12/08/2020 CLINICAL DATA:  Anterior knee pain after fall EXAM: LEFT KNEE - COMPLETE 4+ VIEW COMPARISON:  None. FINDINGS: No evidence of fracture, dislocation, or joint effusion. No evidence of arthropathy or other focal bone abnormality. Soft tissues are unremarkable. IMPRESSION: No acute osseous abnormality. Electronically Signed   By: Dahlia Bailiff MD   On: 12/08/2020 21:13     CBC Recent Labs  Lab 12/08/20 1812 12/09/20 0552  WBC 14.1* 11.6*  HGB 11.9* 10.4*  HCT 36.6 32.1*  PLT 368 318  MCV 82.6 81.9  MCH 26.9 26.5  MCHC 32.5 32.4  RDW 14.5 14.7  LYMPHSABS 2.8  --   MONOABS 0.8  --   EOSABS 0.4  --   BASOSABS 0.1  --     Chemistries  Recent Labs  Lab 12/08/20 1812 12/08/20 2030 12/09/20 0552 12/09/20 1338 12/10/20 0545 12/11/20 0520  NA 135  --  141  --  144 141  K <2.0*  --  <2.0* <2.0* 3.3* 3.6  CL 90*  --  97*  --  105 106  CO2 34*  --  34*  --  31 28  GLUCOSE 161*  --  126*  --  110* 96  BUN <5*  --  <5*  --  <5* <5*   CREATININE 0.88  --  0.69  --  0.60 0.57  CALCIUM 9.4  --  8.1*  --  7.4* 7.9*  MG  --  1.8 2.1  --  1.9  --   AST 75*  --  68*  --   --   --   ALT 37  --  33  --   --   --   ALKPHOS 74  --  63  --   --   --   BILITOT 0.7  --  0.7  --   --   --    ------------------------------------------------------------------------------------------------------------------ No results for input(s): CHOL, HDL, LDLCALC, TRIG, CHOLHDL, LDLDIRECT in the last 72 hours.  No results found for: HGBA1C ------------------------------------------------------------------------------------------------------------------ Recent Labs    12/09/20 0615  TSH 1.869   ------------------------------------------------------------------------------------------------------------------ No results for input(s): VITAMINB12, FOLATE, FERRITIN, TIBC, IRON, RETICCTPCT in the last 72 hours.  Coagulation profile No results for input(s): INR, PROTIME in the last 168 hours.  No results for input(s): DDIMER in the last 72 hours.  Cardiac Enzymes No results for input(s): CKMB, TROPONINI, MYOGLOBIN in the last 168 hours.  Invalid input(s): CK ------------------------------------------------------------------------------------------------------------------ No results found for: BNP   Roxan Hockey M.D on 12/11/2020 at 5:05 PM  Go to [email or web address] - for contact info  [facility] - Office  [phone]

## 2020-12-12 LAB — BASIC METABOLIC PANEL
Anion gap: 6 (ref 5–15)
BUN: <5 mg/dL — ABNORMAL LOW (ref 6–20)
CO2: 27 mmol/L (ref 22–32)
Calcium: 7.8 mg/dL — ABNORMAL LOW (ref 8.9–10.3)
Chloride: 108 mmol/L (ref 98–111)
Creatinine, Ser: 0.65 mg/dL (ref 0.44–1.00)
GFR, Estimated: >60 mL/min (ref >60)
Glucose, Bld: 94 mg/dL (ref 70–99)
Potassium: 3 mmol/L — ABNORMAL LOW (ref 3.5–5.1)
Sodium: 141 mmol/L (ref 135–145)

## 2020-12-12 LAB — HEPATIC FUNCTION PANEL
ALT: 40 U/L (ref 0–44)
AST: 55 U/L — ABNORMAL HIGH (ref 15–41)
Albumin: 2.5 g/dL — ABNORMAL LOW (ref 3.5–5.0)
Alkaline Phosphatase: 54 U/L (ref 38–126)
Bilirubin, Direct: <0.1 mg/dL (ref 0.0–0.2)
Total Bilirubin: 0.4 mg/dL (ref 0.3–1.2)
Total Protein: 5.1 g/dL — ABNORMAL LOW (ref 6.5–8.1)

## 2020-12-12 LAB — CK: Total CK: 2220 U/L — ABNORMAL HIGH (ref 38–234)

## 2020-12-12 MED ORDER — POTASSIUM CHLORIDE CRYS ER 20 MEQ PO TBCR
40.0000 meq | EXTENDED_RELEASE_TABLET | Freq: Once | ORAL | Status: AC
  Administered 2020-12-12: 40 meq via ORAL
  Filled 2020-12-12: qty 2

## 2020-12-12 MED ORDER — POTASSIUM CHLORIDE CRYS ER 20 MEQ PO TBCR
40.0000 meq | EXTENDED_RELEASE_TABLET | ORAL | Status: AC
  Administered 2020-12-12 (×2): 40 meq via ORAL
  Filled 2020-12-12 (×2): qty 2

## 2020-12-12 MED ORDER — POTASSIUM CHLORIDE ER 20 MEQ PO TBCR: 20.0000 meq | EXTENDED_RELEASE_TABLET | Freq: Every day | ORAL | 0 refills | Status: DC

## 2020-12-12 MED ORDER — NICOTINE 14 MG/24HR TD PT24: 14.0000 mg | MEDICATED_PATCH | Freq: Every day | TRANSDERMAL | 0 refills | Status: DC

## 2020-12-12 MED ORDER — POTASSIUM CHLORIDE 10 MEQ/100ML IV SOLN
10.0000 meq | INTRAVENOUS | Status: AC
Start: 2020-12-12 — End: 2020-12-12
  Administered 2020-12-12 (×2): 10 meq via INTRAVENOUS
  Filled 2020-12-12 (×2): qty 100

## 2020-12-12 NOTE — TOC Progression Note (Signed)
Pt stable for dc today per MD. There was a TOC consult entered at admission for medication assistance. Per MD, pt will only dc on Prednisone which is on the $4 list. Pt has a PCP. Consult for medication assistance cleared.

## 2020-12-12 NOTE — Discharge Instructions (Signed)
1)Follow up with Rheumatologist--- Dr. Stefano Gaul Aryal----Address: [address] [place], [place], [place] [address], Phone: [phone] -In 1 to 2 weeks for further work-up of possible polymyositis or other connective tissue disorder that may explain your muscle aches/pains/weakness  2)Repeat CMP and CBC Blood Test with primary care provider  Ms Jerrell Belfast, FNP  3) complete abstinence from tobacco advised, may use over-the-counter nicotine patch

## 2020-12-12 NOTE — Progress Notes (Signed)
MD made aware of KCL of 3.0 via secure chat.

## 2020-12-12 NOTE — Discharge Summary (Signed)
Alejandra Riley, is a 50 y.o. female  DOB 1971/05/17  MRN [id number].  Admission date:  12/08/2020  Admitting Physician  Alejandra Plate, DO  Discharge Date:  12/12/2020   Primary MD  House, Alejandra Goody, FNP  Recommendations for primary care physician for things to follow:   1)Follow up with Rheumatologist--- Dr. Lenetta Quaker Aryal----Address: [address] [place], [place], [place] [address], Phone: [phone] -In 1 to 2 weeks for further work-up of possible polymyositis or other connective tissue disorder that may explain your muscle aches/pains/weakness  2)Repeat CMP and CBC Blood Test with primary care provider  Ms Alejandra Backbone, FNP  3) complete abstinence from tobacco advised, may use over-the-counter nicotine patch   Admission Diagnosis  Hypokalemia [E87.6] Acute hypokalemia [E87.6] Non-traumatic rhabdomyolysis [M62.82]   Discharge Diagnosis  Hypokalemia [E87.6] Acute hypokalemia [E87.6] Non-traumatic rhabdomyolysis [M62.82]    Principal Problem:   Acute hypokalemia Active Problems:   Non-traumatic rhabdomyolysis   Tobacco abuse >> 2P/day      Past Medical History:  Diagnosis Date  . Medical history non-contributory     Past Surgical History:  Procedure Laterality Date  . TUBAL LIGATION        HPI  from the history and physical done on the day of admission:   Alejandra Riley  is a 50 y.o. female, with no known medical history presents the ED with a chief complaint of weakness.  Patient reports that she has had weakness in her hands and legs, contractures in her hands, muscle cramping in her legs, for 4 days.  It is worse in the morning.  Its present in both arms but worse on the left than the right.  She describes the pain in her legs as an aching, like a muscle ache.  Pain starts in her knee and shoots up towards her hip.  It is not radiating from her back.  She reports that  this pain actually started before the weakness, approximately 8 days ago.  She reports that she has been treated for hypokalemia in the past but never hospitalized for it.  She has no personal history of kidney disease, reports that her mother had kidney disease.  She has no personal history of any endocrine disorder, but reports her mother did have diabetes.  Patient reports urinary frequency.  She denies any concerns with her blood pressure.  She denies any nausea, vomiting, diarrhea.  She does report that every other day she had 1 loose stool, and then on the office today she has 1 formed stool.  In review of systems she does admit that she was dizzy once 2 to 3 months ago, it was not accompanied by palpitations or chest pain.  Patient does smoke, does not use illicit drugs, does not drink alcohol, is not vaccinated for COVID, patient is full code.  In the ED Temperature 98.4, heart rate 58-71, respiratory rate 14-24, blood pressure 129/78, satting at 98% Initial troponin is 28, and 37 Leukocytosis with a white blood cell count of 14.1,  chemistry shows a potassium of less than 2, chloride 90, bicarb 34, albumin 3.4, AST 75 Initial EKG is of poor quality, repeat pending 40 mEq of potassium given p.o., 3 g of potassium given IV in the ED, magnesium and IV fluids also given in the ED Patient is found to have a CPK of 4007 Admission requested for further work-up of rhabdo and hypokalemia      Hospital Course:     Brief Summary:- 50 year old smoker admitted on 12/09/2020 due to status post fall at home with weakness in her hands and legs, contractures in her hands, muscle cramping in her legs several days PTA and found to have persistent, recurrent hypokalemia and elevated CPK levels  A/p 1)Recurrent/Persistent Hypokalemia--a.m. cortisol is low at 5.4,  ACTH test on 12/11/20 is WNL (a.m. baseline cortisol 9.0, 30 minutes post injection 19.8, 60 minutes postinjection 26.5) - aldosterone/renin  ratio test is pending -Serum magnesium is 1.9 -Continue potassium replacements No  Nausea, Vomiting or Diarrhea   2)Non-traumatic rhabdomyolysis/myalgias-- -patient denies any trauma -Patient with proximal muscle weakness/myalgia-elevated CPK makes PMR less likely, elevated CPK makes polymyositis more likely --- CK 4,007 >> 3,609>> 5,170 >> 2,220 -CK improved with hydration, creatinine normal at 0.65 -ESR 68, CRP 1.5, TSH is 1.86 -  3)Tobacco Abuse--smoking cessation advised okay to use nicotine patch  4)Transaminitis--- AST is down to 55 from 75, ALT remains normal, T bili 0.4 alk phos 54  Disposition/Need for in-Hospital Stay- patient unable to be discharged at this time due to persistent, recurrent hypokalemia requiring IV replacements and further work-up for possible adrenal component*  Status is: Inpatient  Remains inpatient appropriate because:Please see disposition above   Disposition: The patient is from: Home  Anticipated d/c is to: Home  Code Status :  -  Code Status: Full Code   Family Communication:    NA (patient is alert, awake and coherent)   Consults  :   Outpatient follow-up with rheumatology advised  Discharge Condition: stable  Follow UP   Follow-up Information    House, Alejandra Goody, FNP Follow up on 12/15/2020.   Specialty: Nurse Practitioner Why: Repeat CMP and Repeat CBC blood test Contact information: [address] [place] [address] [phone]        Lahoma Rocker, MD. Schedule an appointment as soon as possible for a visit in 1 week(s).   Specialty: Rheumatology Why: work-up of possible polymyositis or other connective tissue disorder t Contact information: [place] [address] [phone]                Diet and Activity recommendation:  As advised  Discharge Instructions    Discharge Instructions    Call MD for:  difficulty breathing, headache or visual  disturbances   Complete by: As directed    Call MD for:  persistant dizziness or light-headedness   Complete by: As directed    Call MD for:  persistant nausea and vomiting   Complete by: As directed    Call MD for:  severe uncontrolled pain   Complete by: As directed    Call MD for:  temperature >100.4   Complete by: As directed    Diet - low sodium heart healthy   Complete by: As directed    Discharge instructions   Complete by: As directed    1)Follow up with Rheumatologist--- Dr. Lenetta Quaker Aryal----Address: [address] [place], [place], [place] [address], Phone: [phone] -In 1 to 2 weeks for further work-up  of possible polymyositis or other connective tissue disorder that may explain your muscle aches/pains/weakness  2)Repeat CMP and CBC Blood Test with primary care provider  Ms Alejandra Backbone, FNP  3) complete abstinence from tobacco advised, may use over-the-counter nicotine patch   Increase activity slowly   Complete by: As directed         Discharge Medications     Allergies as of 12/12/2020      Reactions   Amoxicillin Hives   Penicillins       Medication List    TAKE these medications   cholecalciferol 25 MCG (1000 UNIT) tablet Commonly known as: VITAMIN D3 Take 5,000 Units by mouth daily.   nicotine 14 mg/24hr patch Commonly known as: NICODERM CQ - dosed in mg/24 hours Place 1 patch (14 mg total) onto the skin daily. Start taking on: Dec 13, 2020   Potassium Chloride ER 20 MEQ Tbcr Take 20 mEq by mouth daily. 1 tab daily by mouth       Major procedures and Radiology Reports - PLEASE review detailed and final reports for all details, in brief -   DG Knee Complete 4 Views Left  Result Date: 12/08/2020 CLINICAL DATA:  Anterior knee pain after fall EXAM: LEFT KNEE - COMPLETE 4+ VIEW COMPARISON:  None. FINDINGS: No evidence of fracture, dislocation, or joint effusion. No evidence of arthropathy or other focal bone abnormality. Soft tissues are  unremarkable. IMPRESSION: No acute osseous abnormality. Electronically Signed   By: Dahlia Bailiff MD   On: 12/08/2020 21:13    Micro Results   Recent Results (from the past 240 hour(s))  Resp Panel by RT-PCR (Flu A&B, Covid) Nasopharyngeal Swab     Status: None   Collection Time: 12/08/20  9:54 PM   Specimen: Nasopharyngeal Swab; Nasopharyngeal(NP) swabs in vial transport medium  Result Value Ref Range Status   SARS Coronavirus 2 by RT PCR NEGATIVE NEGATIVE Final    Comment: (NOTE) SARS-CoV-2 target nucleic acids are NOT DETECTED.  The SARS-CoV-2 RNA is generally detectable in upper respiratory specimens during the acute phase of infection. The lowest concentration of SARS-CoV-2 viral copies this assay can detect is 138 copies/mL. A negative result does not preclude SARS-Cov-2 infection and should not be used as the sole basis for treatment or other patient management decisions. A negative result may occur with  improper specimen collection/handling, submission of specimen other than nasopharyngeal swab, presence of viral mutation(s) within the areas targeted by this assay, and inadequate number of viral copies(<138 copies/mL). A negative result must be combined with clinical observations, patient history, and epidemiological information. The expected result is Negative.  Fact Sheet for Patients:  EntrepreneurPulse.com.au  Fact Sheet for Healthcare Providers:  IncredibleEmployment.be  This test is no t yet approved or cleared by the [place] FDA and  has been authorized for detection and/or diagnosis of SARS-CoV-2 by FDA under an Emergency Use Authorization (EUA). This EUA will remain  in effect (meaning this test can be used) for the duration of the COVID-19 declaration under Section 564(b)(1) of the Act, 21 U.S.C.section 360bbb-3(b)(1), unless the authorization is terminated  or revoked sooner.       Influenza A by PCR NEGATIVE  NEGATIVE Final   Influenza B by PCR NEGATIVE NEGATIVE Final    Comment: (NOTE) The Xpert Xpress SARS-CoV-2/FLU/RSV plus assay is intended as an aid in the diagnosis of influenza from Nasopharyngeal swab specimens and should not be used as a sole basis for treatment. Nasal washings  and aspirates are unacceptable for Xpert Xpress SARS-CoV-2/FLU/RSV testing.  Fact Sheet for Patients: EntrepreneurPulse.com.au  Fact Sheet for Healthcare Providers: IncredibleEmployment.be  This test is not yet approved or cleared by the [place] FDA and has been authorized for detection and/or diagnosis of SARS-CoV-2 by FDA under an Emergency Use Authorization (EUA). This EUA will remain in effect (meaning this test can be used) for the duration of the COVID-19 declaration under Section 564(b)(1) of the Act, 21 U.S.C. section 360bbb-3(b)(1), unless the authorization is terminated or revoked.  Performed at [facility], [address], [place], [place] [address]     Today   Subjective    Keiosha Cancro today has no new complaints ' No fever  Or chills   No Nausea, Vomiting or Diarrhea       Patient has been seen and examined prior to discharge   Objective   Blood pressure 126/65, pulse (!) 54, temperature 97.8 F (36.6 C), temperature source Oral, resp. rate 18, height 5' 6" (1.676 m), weight 73.5 kg, last menstrual period 05/20/2017, SpO2 92 %.   Intake/Output Summary (Last 24 hours) at 12/12/2020 1202 Last data filed at 12/12/2020 0900 Gross per 24 hour  Intake 2720 ml  Output --  Net 2720 ml    Exam Gen:- Awake Alert, no acute distress  HEENT:- [place].AT, No sclera icterus Neck-Supple Neck,No JVD,.  Lungs-  CTAB , good air movement bilaterally  CV- S1, S2 normal, regular Abd-  +ve B.Sounds, Abd Soft, No tenderness,    Extremity/Skin:- No  edema,   good pulses, improved thigh and hip area muscle weakness and discomfort, overlying skin without  erythema induration or rash Psych-affect is appropriate, oriented x3 Neuro-no new focal deficits, no tremors    Data Review   CBC w Diff:  Lab Results  Component Value Date   WBC 11.6 (H) 12/09/2020   HGB 10.4 (L) 12/09/2020   HCT 32.1 (L) 12/09/2020   PLT 318 12/09/2020   LYMPHOPCT 20 12/08/2020   MONOPCT 6 12/08/2020   EOSPCT 3 12/08/2020   BASOPCT 0 12/08/2020    CMP:  Lab Results  Component Value Date   NA 141 12/12/2020   K 3.0 (L) 12/12/2020   CL 108 12/12/2020   CO2 27 12/12/2020   BUN <5 (L) 12/12/2020   CREATININE 0.65 12/12/2020   PROT 5.1 (L) 12/12/2020   ALBUMIN 2.5 (L) 12/12/2020   BILITOT 0.4 12/12/2020   ALKPHOS 54 12/12/2020   AST 55 (H) 12/12/2020   ALT 40 12/12/2020  .   Total Discharge time is about 33 minutes  Roxan Hockey M.D on 12/12/2020 at 12:02 PM  Go to [email or web address] -  for contact info  [facility] - Office  [phone]

## 2020-12-18 LAB — ALDOSTERONE + RENIN ACTIVITY W/ RATIO
ALDO / PRA Ratio: <5 (ref 0.0–30.0)
Aldosterone: <1 ng/dL (ref 0.0–30.0)
PRA LC/MS/MS: 0.199 ng/mL/hr (ref 0.167–5.380)

## 2021-01-16 ENCOUNTER — Ambulatory Visit: Payer: Self-pay | Admitting: Family Medicine

## 2021-02-06 ENCOUNTER — Ambulatory Visit: Payer: Self-pay | Admitting: Family Medicine

## 2021-02-18 ENCOUNTER — Ambulatory Visit (INDEPENDENT_AMBULATORY_CARE_PROVIDER_SITE_OTHER): Payer: 59 | Admitting: Family Medicine

## 2021-02-18 ENCOUNTER — Encounter: Payer: Self-pay | Admitting: Family Medicine

## 2021-02-18 VITALS — BP 115/76 | HR 56 | Temp 96.4°F | Resp 20 | Ht 66.0 in | Wt 162.0 lb

## 2021-02-18 DIAGNOSIS — Z720 Tobacco use: Secondary | ICD-10-CM

## 2021-02-18 DIAGNOSIS — E876 Hypokalemia: Secondary | ICD-10-CM | POA: Diagnosis not present

## 2021-02-18 DIAGNOSIS — G43009 Migraine without aura, not intractable, without status migrainosus: Secondary | ICD-10-CM | POA: Diagnosis not present

## 2021-02-18 MED ORDER — RIZATRIPTAN BENZOATE 5 MG PO TABS: 5.0000 mg | ORAL_TABLET | ORAL | 0 refills | Status: DC | PRN

## 2021-02-18 NOTE — Progress Notes (Signed)
New Patient Office Visit  Assessment & Plan:  1. Migraine without aura and without status migrainosus, not intractable Rx'd Maxalt to try for abortive therapy when Ibuprofen is not effective. Education provided on migraines. - rizatriptan (MAXALT) 5 MG tablet; Take 1 tablet (5 mg total) by mouth as needed for migraine. May repeat in 2 hours if needed  Dispense: 10 tablet; Refill: 0  2. Hypokalemia Continue supplement. Keep appointment with nephrology for further work-up. - BMP8+EGFR  3. Tobacco use Encouraged smoking cessation.    Follow-up: Return in about 3 months (around 05/21/2021) for migraines.   [name], MSN, APRN, FNP-C [facility]  Subjective:  Patient ID: Alejandra Riley, female    DOB: Apr 14, 1971  Age: 50 y.o. MRN: [id number]  Patient Care Team: House, Deliah Goody, FNP as PCP - General (Nurse Practitioner)  CC:  Chief Complaint  Patient presents with   Establish Care    New    HPI Alejandra Riley presents to establish care. She is transferring from the [facility] Department.   Tobacco use: was doing well not smoking in the hospital wearing Nicoderm patches, but has been smoking and does not feel they are as helpful now. She has been smoking for the past 10 years. Smokes 1 pack/day.   Hypokalemia: taking OTC potassium supplement since she was hospitalized in May 2022.  During this hospitalization patient was found to have persistent hypokalemia and non-traumatic rhabdomyolysis. She saw rheumatology when she was discharged and they did not find anything wrong with her per her report. She is now scheduled to see [facility] for further work-up.  Migraines: patient has a history of migraines ~1/month which are normally relieved with Ibuprofen. Sometimes they do occasionally last for a day or two during which time the Ibuprofen does not work. She had a prescription for Imitrex given to her, but she never took it due to the  side effect of going blind.  10/30/19  Review of Systems  Constitutional:  Negative for chills, fever, malaise/fatigue and weight loss.  HENT:  Negative for congestion, ear discharge, ear pain, nosebleeds, sinus pain, sore throat and tinnitus.   Eyes:  Negative for blurred vision, double vision, pain, discharge and redness.  Respiratory:  Negative for cough, shortness of breath and wheezing.   Cardiovascular:  Negative for chest pain, palpitations and leg swelling.  Gastrointestinal:  Negative for abdominal pain, constipation, diarrhea, heartburn, nausea and vomiting.  Genitourinary:  Negative for dysuria, frequency and urgency.  Musculoskeletal:  Negative for myalgias.  Skin:  Negative for rash.  Neurological:  Positive for headaches. Negative for dizziness, seizures and weakness.  Psychiatric/Behavioral:  Negative for depression, substance abuse and suicidal ideas. The patient is not nervous/anxious.    Current Outpatient Medications:    nicotine (NICODERM CQ - DOSED IN MG/24 HOURS) 14 mg/24hr patch, Place 1 patch (14 mg total) onto the skin daily., Disp: 28 patch, Rfl: 0   Potassium 99 MG TABS, Take 2 tablets by mouth daily., Disp: , Rfl:   Allergies  Allergen Reactions   Amoxicillin Hives   Penicillins     Past Medical History:  Diagnosis Date   Medical history non-contributory    Migraines     Past Surgical History:  Procedure Laterality Date   TUBAL LIGATION      Family History  Problem Relation Age of Onset   Vision loss Mother    Stomach cancer Father    Other Son  club foot    Social History   Socioeconomic History   Marital status: Significant Other    Spouse name: Charles   Number of children: 3   Years of education: Not on file   Highest education level: Not on file  Occupational History   Not on file  Tobacco Use   Smoking status: Every Day    Packs/day: 1.50    Types: Cigarettes   Smokeless tobacco: Never  Vaping Use   Vaping Use: Never  used  Substance and Sexual Activity   Alcohol use: Not Currently   Drug use: Never   Sexual activity: Not on file  Other Topics Concern   Not on file  Social History Narrative   Lives at home with fiance and youngest son    Social Determinants of Health   Financial Resource Strain: Not on file  Food Insecurity: Not on file  Transportation Needs: Not on file  Physical Activity: Not on file  Stress: Not on file  Social Connections: Not on file  Intimate Partner Violence: Not on file    Objective:   Today's Vitals: BP 115/76   Pulse (!) 56   Temp (!) 96.4 F (35.8 C)   Resp 20   Ht _0  (1.676 m)   Wt 162 lb (73.5 kg)   LMP 05/20/2017 Comment: tubal ligation  SpO2 97%   BMI 26.15 kg/m   Physical Exam Vitals reviewed.  Constitutional:      General: She is not in acute distress.    Appearance: Normal appearance. She is overweight. She is not ill-appearing, toxic-appearing or diaphoretic.  HENT:     Head: Normocephalic and atraumatic.  Eyes:     General: No scleral icterus.       Right eye: No discharge.        Left eye: No discharge.     Conjunctiva/sclera: Conjunctivae normal.  Cardiovascular:     Rate and Rhythm: Normal rate and regular rhythm.     Heart sounds: Normal heart sounds. No murmur heard.   No friction rub. No gallop.  Pulmonary:     Effort: Pulmonary effort is normal. No respiratory distress.     Breath sounds: Normal breath sounds. No stridor. No wheezing, rhonchi or rales.  Musculoskeletal:        General: Normal range of motion.     Cervical back: Normal range of motion.  Skin:    General: Skin is warm and dry.     Capillary Refill: Capillary refill takes less than 2 seconds.  Neurological:     General: No focal deficit present.     Mental Status: She is alert and oriented to person, place, and time. Mental status is at baseline.  Psychiatric:        Mood and Affect: Mood normal.        Behavior: Behavior normal.        Thought Content:  Thought content normal.        Judgment: Judgment normal.

## 2021-02-19 LAB — BMP8+EGFR
BUN/Creatinine Ratio: 5 — ABNORMAL LOW (ref 9–23)
BUN: 4 mg/dL — ABNORMAL LOW (ref 6–24)
CO2: 29 mmol/L (ref 20–29)
Calcium: 10.1 mg/dL (ref 8.7–10.2)
Chloride: 97 mmol/L (ref 96–106)
Creatinine, Ser: 0.85 mg/dL (ref 0.57–1.00)
Glucose: 114 mg/dL — ABNORMAL HIGH (ref 65–99)
Potassium: 3.5 mmol/L (ref 3.5–5.2)
Sodium: 142 mmol/L (ref 134–144)
eGFR: 84 mL/min/{1.73_m2} (ref >59)

## 2021-02-21 ENCOUNTER — Encounter: Payer: Self-pay | Admitting: Family Medicine

## 2021-02-21 DIAGNOSIS — G43009 Migraine without aura, not intractable, without status migrainosus: Secondary | ICD-10-CM | POA: Insufficient documentation

## 2021-02-23 ENCOUNTER — Encounter: Payer: Self-pay | Admitting: Family Medicine

## 2021-03-11 ENCOUNTER — Other Ambulatory Visit: Payer: Self-pay | Admitting: Family Medicine

## 2021-03-11 DIAGNOSIS — Z1231 Encounter for screening mammogram for malignant neoplasm of breast: Secondary | ICD-10-CM

## 2021-04-06 ENCOUNTER — Other Ambulatory Visit: Payer: Self-pay

## 2021-04-06 ENCOUNTER — Ambulatory Visit
Admission: RE | Admit: 2021-04-06 | Discharge: 2021-04-06 | Disposition: A | Discharge status: Home / Self Care | Payer: 59 | Source: Ambulatory Visit | Attending: Family Medicine | Admitting: Family Medicine

## 2021-04-06 DIAGNOSIS — Z1231 Encounter for screening mammogram for malignant neoplasm of breast: Secondary | ICD-10-CM

## 2021-05-21 ENCOUNTER — Encounter: Payer: Self-pay | Admitting: Family Medicine

## 2021-05-21 ENCOUNTER — Other Ambulatory Visit: Payer: Self-pay

## 2021-05-21 ENCOUNTER — Ambulatory Visit: Payer: 59 | Admitting: Family Medicine

## 2021-05-21 VITALS — BP 120/84 | HR 77 | Temp 96.2°F | Ht 66.0 in | Wt 166.4 lb

## 2021-05-21 DIAGNOSIS — R7309 Other abnormal glucose: Secondary | ICD-10-CM

## 2021-05-21 DIAGNOSIS — R7303 Prediabetes: Secondary | ICD-10-CM

## 2021-05-21 DIAGNOSIS — G479 Sleep disorder, unspecified: Secondary | ICD-10-CM | POA: Diagnosis not present

## 2021-05-21 DIAGNOSIS — G43009 Migraine without aura, not intractable, without status migrainosus: Secondary | ICD-10-CM | POA: Diagnosis not present

## 2021-05-21 DIAGNOSIS — N951 Menopausal and female climacteric states: Secondary | ICD-10-CM | POA: Diagnosis not present

## 2021-05-21 DIAGNOSIS — R1011 Right upper quadrant pain: Secondary | ICD-10-CM

## 2021-05-21 HISTORY — DX: Prediabetes: R73.03

## 2021-05-21 LAB — BAYER DCA HB A1C WAIVED: HB A1C (BAYER DCA - WAIVED): 6.2 % — ABNORMAL HIGH (ref 4.8–5.6)

## 2021-05-21 MED ORDER — NURTEC 75 MG PO TBDP: 75.0000 mg | ORAL_TABLET | Freq: Every day | ORAL | 2 refills | Status: DC | PRN

## 2021-05-21 NOTE — Patient Instructions (Signed)
Black Cohosh or Amberen for hot flashes.  Melatonin or Zzzquil for sleep.

## 2021-05-21 NOTE — Progress Notes (Signed)
Assessment & Plan:  1. Migraine without aura and without status migrainosus, not intractable Uncontrolled. Not willing to try Imitrex due to possible side effects. Failed treatment with NSAIDs and Maxalt. Started Nurtec - samples provided. I did ask patient to let me know if she finds it helpful and I will send in a prescription. Education provided on chronic migraines. - Rimegepant Sulfate (NURTEC) 75 MG TBDP; Take 75 mg by mouth daily as needed. Do not repeat for 48 hours.  Dispense: 16 tablet; Refill: 2  2. Elevated glucose - Bayer DCA Hb A1c Waived  3. Hot flashes due to menopause Recommended black cohosh or Amberen.  4. Difficulty sleeping Recommended melatonin or Zzzquil.  5. RUQ abdominal pain - US Abdomen Limited RUQ (LIVER/GB); Future   Return in about 3 months (around 08/21/2021) for annual physical.  [name], MSN, APRN, FNP-C [facility]  Subjective:    Patient ID: Alejandra Riley, female    DOB: 1971/07/04, 50 y.o.   MRN: [id number]  Patient Care Team: Loman Brooklyn, FNP as PCP - General (Family Medicine)   Chief Complaint:  Chief Complaint  Patient presents with   Migraine    3 month follow up - Patient states it is no better.     HPI: Alejandra Riley is a 50 y.o. female presenting on 05/21/2021 for Migraine (3 month follow up - Patient states it is no better. )  Migraines Patient has a history of migraines ~1/month which are normally relieved with Ibuprofen. Sometimes they do occasionally last for a day or two during which time the Ibuprofen does not work. She had a prescription for Imitrex given to her, but she never took it due to the side effect of going blind. She was recently prescribed Maxalt which she states didn't touch her migraines.   Elevated Glucose Patient's nephrologist asked that we screen for diabetes due to elevated glucose readings.  New complaints: Patient is also concerned about the following:  - hot  flashes due to menopause  - difficulty staying asleep at night. She has previously tried melatonin which was somewhat helpful; she does not recall the dose.  - pain in the right upper abdomen. States it is not related to meals. Denies nausea, vomiting, or diarrhea. Pain occurs if she coughs or bends over. It is been present for several months now.   Social history:  Relevant past medical, surgical, family and social history reviewed and updated as indicated. Interim medical history since our last visit reviewed.  Allergies and medications reviewed and updated.  DATA REVIEWED: CHART IN EPIC  ROS: Negative unless specifically indicated above in HPI.    Current Outpatient Medications:    nicotine (NICODERM CQ - DOSED IN MG/24 HOURS) 14 mg/24hr patch, Place 1 patch (14 mg total) onto the skin daily. (Patient not taking: Reported on 05/21/2021), Disp: 28 patch, Rfl: 0   Potassium 99 MG TABS, Take 2 tablets by mouth daily., Disp: , Rfl:    Allergies  Allergen Reactions   Amoxicillin Hives   Penicillins    Past Medical History:  Diagnosis Date   Left breast mass 09/20/2017   Benign   Migraines     Past Surgical History:  Procedure Laterality Date   TUBAL LIGATION  1999    Social History   Socioeconomic History   Marital status: Significant Other    Spouse name: Juanda Crumble   Number of children: 3   Years of education: Not on file   Highest education  level: Not on file  Occupational History   Not on file  Tobacco Use   Smoking status: Every Day    Packs/day: 1.50    Types: Cigarettes    Start date: 2012   Smokeless tobacco: Never  Vaping Use   Vaping Use: Never used  Substance and Sexual Activity   Alcohol use: Not Currently   Drug use: Never   Sexual activity: Not on file  Other Topics Concern   Not on file  Social History Narrative   Lives at home with fiance and youngest son    Social Determinants of Health   Financial Resource Strain: Not on file  Food  Insecurity: Not on file  Transportation Needs: Not on file  Physical Activity: Not on file  Stress: Not on file  Social Connections: Not on file  Intimate Partner Violence: Not on file        Objective:    BP 120/84   Pulse 77   Temp (!) 96.2 F (35.7 C) (Temporal)   Ht '5\' 6"'  (1.676 m)   Wt 166 lb 6.4 oz (75.5 kg)   LMP 05/20/2017 Comment: tubal ligation  SpO2 99%   BMI 26.86 kg/m   Wt Readings from Last 3 Encounters:  05/21/21 166 lb 6.4 oz (75.5 kg)  02/18/21 162 lb (73.5 kg)  12/08/20 162 lb (73.5 kg)    Physical Exam Vitals reviewed.  Constitutional:      General: She is not in acute distress.    Appearance: Normal appearance. She is overweight. She is not ill-appearing, toxic-appearing or diaphoretic.  HENT:     Head: Normocephalic and atraumatic.  Eyes:     General: No scleral icterus.       Right eye: No discharge.        Left eye: No discharge.     Conjunctiva/sclera: Conjunctivae normal.  Cardiovascular:     Rate and Rhythm: Normal rate.  Pulmonary:     Effort: Pulmonary effort is normal. No respiratory distress.  Musculoskeletal:        General: Normal range of motion.     Cervical back: Normal range of motion.  Skin:    General: Skin is warm and dry.     Capillary Refill: Capillary refill takes less than 2 seconds.  Neurological:     General: No focal deficit present.     Mental Status: She is alert and oriented to person, place, and time. Mental status is at baseline.  Psychiatric:        Mood and Affect: Mood normal.        Behavior: Behavior normal.        Thought Content: Thought content normal.        Judgment: Judgment normal.    Lab Results  Component Value Date   TSH 1.869 12/09/2020   Lab Results  Component Value Date   WBC 11.6 (H) 12/09/2020   HGB 10.4 (L) 12/09/2020   HCT 32.1 (L) 12/09/2020   MCV 81.9 12/09/2020   PLT 318 12/09/2020   Lab Results  Component Value Date   NA 142 02/18/2021   K 3.5 02/18/2021   CO2 29  02/18/2021   GLUCOSE 114 (H) 02/18/2021   BUN 4 (L) 02/18/2021   CREATININE 0.85 02/18/2021   BILITOT 0.4 12/12/2020   ALKPHOS 54 12/12/2020   AST 55 (H) 12/12/2020   ALT 40 12/12/2020   PROT 5.1 (L) 12/12/2020   ALBUMIN 2.5 (L) 12/12/2020   CALCIUM 10.1 02/18/2021  ANIONGAP 6 12/12/2020   EGFR 84 02/18/2021   No results found for: CHOL No results found for: HDL No results found for: LDLCALC No results found for: TRIG No results found for: CHOLHDL No results found for: HGBA1C

## 2021-05-28 ENCOUNTER — Other Ambulatory Visit: Payer: Self-pay

## 2021-05-28 ENCOUNTER — Ambulatory Visit (HOSPITAL_COMMUNITY)
Admission: RE | Admit: 2021-05-28 | Discharge: 2021-05-28 | Disposition: A | Discharge status: Home / Self Care | Payer: 59 | Source: Ambulatory Visit | Attending: Family Medicine | Admitting: Family Medicine

## 2021-05-28 DIAGNOSIS — R1011 Right upper quadrant pain: Secondary | ICD-10-CM | POA: Diagnosis not present

## 2021-05-29 ENCOUNTER — Telehealth: Payer: Self-pay | Admitting: Family Medicine

## 2021-05-29 DIAGNOSIS — K8020 Calculus of gallbladder without cholecystitis without obstruction: Secondary | ICD-10-CM

## 2021-05-29 NOTE — Telephone Encounter (Signed)
MyChart message: You do have a large stone in your gallbladder. Would you like me to refer you to a surgeon to see if you are a candidate for removal of your gallbladder?  Does she want a referral?

## 2021-05-29 NOTE — Telephone Encounter (Signed)
Patient would like referral and would like to go to [facility] or [facility].

## 2021-05-29 NOTE — Telephone Encounter (Signed)
Wanting [place] results- please advise

## 2021-06-01 NOTE — Telephone Encounter (Signed)
Referral placed.

## 2021-07-16 ENCOUNTER — Ambulatory Visit (INDEPENDENT_AMBULATORY_CARE_PROVIDER_SITE_OTHER): Payer: 59 | Admitting: Family Medicine

## 2021-07-16 ENCOUNTER — Encounter: Payer: Self-pay | Admitting: Family Medicine

## 2021-07-16 DIAGNOSIS — U071 COVID-19: Secondary | ICD-10-CM

## 2021-07-16 MED ORDER — DEXAMETHASONE 6 MG PO TABS: 6.0000 mg | ORAL_TABLET | Freq: Every day | ORAL | 0 refills | Status: AC

## 2021-07-16 MED ORDER — MOLNUPIRAVIR EUA 200MG CAPSULE: 4.0000 | ORAL_CAPSULE | Freq: Two times a day (BID) | ORAL | 0 refills | Status: AC

## 2021-07-16 NOTE — Progress Notes (Signed)
° °  Virtual Visit via Telephone Note  I connected with Alejandra Riley on 07/16/21 at 11:45 AM by telephone and verified that I am speaking with the correct person using two identifiers. Alejandra Riley is currently located at home and nobody is currently with her during this visit. The provider, Gwenlyn Fudge, FNP is located in their office at time of visit.  I discussed the limitations, risks, security and privacy concerns of performing an evaluation and management service by telephone and the availability of in person appointments. I also discussed with the patient that there may be a patient responsible charge related to this service. The patient expressed understanding and agreed to proceed.  Subjective: PCP: Gwenlyn Fudge, FNP  Chief Complaint  Patient presents with   Covid Positive   Patient complains of cough, head congestion, and fever. Onset of symptoms was 1 day ago, unchanged since that time. She is drinking plenty of fluids. Evaluation to date: at home COVID test positive. Treatment to date:  Tylenol . She does smoke.    ROS: Per HPI  Current Outpatient Medications:    Potassium 99 MG TABS, Take 2 tablets by mouth daily., Disp: , Rfl:    Rimegepant Sulfate (NURTEC) 75 MG TBDP, Take 75 mg by mouth daily as needed. Do not repeat for 48 hours., Disp: 16 tablet, Rfl: 2  Allergies  Allergen Reactions   Amoxicillin Hives   Penicillins    Past Medical History:  Diagnosis Date   Left breast mass 09/20/2017   Benign   Migraines    Prediabetes 05/21/2021    Observations/Objective: A&O  No respiratory distress or wheezing audible over the phone Mood, judgement, and thought processes all WNL  Assessment and Plan: 1. COVID-19 Discussed symptom management and symptoms that should prompt her to go to the ER. - molnupiravir EUA (LAGEVRIO) 200 mg CAPS capsule; Take 4 capsules (800 mg total) by mouth 2 (two) times daily for 5 days.  Dispense: 40 capsule; Refill: 0 -  dexamethasone (DECADRON) 6 MG tablet; Take 1 tablet (6 mg total) by mouth daily for 5 days.  Dispense: 5 tablet; Refill: 0   Follow Up Instructions:  I discussed the assessment and treatment plan with the patient. The patient was provided an opportunity to ask questions and all were answered. The patient agreed with the plan and demonstrated an understanding of the instructions.   The patient was advised to call back or seek an in-person evaluation if the symptoms worsen or if the condition fails to improve as anticipated.  The above assessment and management plan was discussed with the patient. The patient verbalized understanding of and has agreed to the management plan. Patient is aware to call the clinic if symptoms persist or worsen. Patient is aware when to return to the clinic for a follow-up visit. Patient educated on when it is appropriate to go to the emergency department.   Time call ended: 11:56 AM  I provided 11 minutes of non-face-to-face time during this encounter.  Deliah Boston, MSN, APRN, FNP-C [facility] 07/16/21

## 2021-07-20 ENCOUNTER — Encounter: Payer: Self-pay | Admitting: Family Medicine

## 2021-08-21 ENCOUNTER — Ambulatory Visit: Payer: 59 | Admitting: Family Medicine

## 2021-09-25 ENCOUNTER — Ambulatory Visit: Payer: 59 | Admitting: Family Medicine

## 2021-10-07 ENCOUNTER — Other Ambulatory Visit: Payer: Self-pay | Admitting: Family Medicine

## 2021-10-07 DIAGNOSIS — G43009 Migraine without aura, not intractable, without status migrainosus: Secondary | ICD-10-CM

## 2021-10-09 ENCOUNTER — Other Ambulatory Visit: Payer: Self-pay | Admitting: *Deleted

## 2021-10-09 DIAGNOSIS — G43009 Migraine without aura, not intractable, without status migrainosus: Secondary | ICD-10-CM

## 2021-10-14 ENCOUNTER — Other Ambulatory Visit: Payer: Self-pay | Admitting: Family Medicine

## 2021-10-14 DIAGNOSIS — G43009 Migraine without aura, not intractable, without status migrainosus: Secondary | ICD-10-CM

## 2021-10-16 ENCOUNTER — Ambulatory Visit: Payer: 59 | Admitting: Family Medicine

## 2021-10-16 ENCOUNTER — Ambulatory Visit: Payer: Self-pay | Admitting: General Surgery

## 2021-10-16 ENCOUNTER — Encounter: Payer: Self-pay | Admitting: Family Medicine

## 2021-10-16 VITALS — BP 134/83 | HR 67 | Temp 97.5°F | Ht 66.0 in | Wt 175.0 lb

## 2021-10-16 DIAGNOSIS — E876 Hypokalemia: Secondary | ICD-10-CM | POA: Diagnosis not present

## 2021-10-16 DIAGNOSIS — Z720 Tobacco use: Secondary | ICD-10-CM | POA: Diagnosis not present

## 2021-10-16 DIAGNOSIS — G43009 Migraine without aura, not intractable, without status migrainosus: Secondary | ICD-10-CM

## 2021-10-16 DIAGNOSIS — R7303 Prediabetes: Secondary | ICD-10-CM

## 2021-10-16 MED ORDER — NURTEC 75 MG PO TBDP: 75.0000 mg | ORAL_TABLET | Freq: Every day | ORAL | 2 refills | Status: DC | PRN

## 2021-10-16 NOTE — H&P (Signed)
?Chief Complaint: Cholelithiasis ?  ?  ?  ?History of Present Illness: ?Alejandra Riley is a 51 y.o. female who is seen today as an office consultation at the request of Dr. Alona Bene for evaluation of Cholelithiasis ?.   ?  ?Patient is a 51 year old female, otherwise healthy who comes in secondary to a large gallstone.  Patient was found to have some pain with certain positioning to the right upper quadrant.  She underwent ultrasound which revealed a 3.8 cm gallstone.  There were no signs of cholecystitis at the time. ?  ?Patient denies any typical symptoms of symptomatic cholelithiasis.  She does state that she has some reflux issues with acidic foods. ?  ?Patient's had previous BTL. ?Of note patient was recently in the hospital secondary to hypokalemia.  She is currently on potassium pills. ?  ?  ?Review of Systems: ?A complete review of systems was obtained from the patient.  I have reviewed this information and discussed as appropriate with the patient.  See HPI as well for other ROS. ?  ?Review of Systems  ?Constitutional: Negative for fever.  ?HENT: Negative for congestion.   ?Eyes: Negative for blurred vision.  ?Respiratory: Negative for cough, shortness of breath and wheezing.   ?Cardiovascular: Negative for chest pain and palpitations.  ?Gastrointestinal: Negative for heartburn.  ?Genitourinary: Negative for dysuria.  ?Musculoskeletal: Negative for myalgias.  ?Skin: Negative for rash.  ?Neurological: Negative for dizziness and headaches.  ?Psychiatric/Behavioral: Negative for depression and suicidal ideas.  ?All other systems reviewed and are negative. ?  ?  ?  ?Medical History: ?Past Medical History ?History reviewed. No pertinent past medical history. ? ?  ?There is no problem list on file for this patient. ?  ?  ?Past Surgical History ?Past Surgical History: ?Procedure Laterality Date ? LAPAROSCOPIC TUBAL LIGATION     ? ?  ?  ?Allergies ?Allergies ?Allergen Reactions ? Penicillins Hives and Unknown ? ?  ?   ?Current Outpatient Medications on File Prior to Visit ?Medication Sig Dispense Refill ? KLOR-CON M20 20 mEq ER tablet Take 20 mEq by mouth once daily     ?  ?No current facility-administered medications on file prior to visit. ?  ?  ?Family History ?Family History ?Problem Relation Age of Onset ? Stroke Mother   ? Hyperlipidemia (Elevated cholesterol) Mother   ? Diabetes Mother   ? ?  ?  ?Social History ?  ?Tobacco Use ?Smoking Status Every Day ? Types: Cigarettes ?Smokeless Tobacco Never ?  ?  ?Social History ?Social History ?  ? ?Socioeconomic History ? Marital status: Unknown ?Tobacco Use ? Smoking status: Every Day ?    Types: Cigarettes ? Smokeless tobacco: Never ?Vaping Use ? Vaping Use: Never used ?Substance and Sexual Activity ? Alcohol use: Not Currently ? Drug use: Never ? ?  ?  ?Objective: ?  ?  ?Vitals: ?  10/16/21 1052 ?BP: 126/76 ?Pulse: 93 ?Temp: 36.3 ?C (97.3 ?F) ?SpO2: 97% ?Weight: 79.1 kg (174 lb 6.4 oz) ?Height: 167.6 cm (5\' 6" ) ?  ?Body mass index is 28.15 kg/m?. ?  ?Physical Exam ?Constitutional:   ?   Appearance: Normal appearance.  ?HENT:  ?   Head: Normocephalic and atraumatic.  ?   Mouth/Throat:  ?   Mouth: Mucous membranes are moist.  ?   Pharynx: Oropharynx is clear.  ?Eyes:  ?   General: No scleral icterus. ?   Pupils: Pupils are equal, round, and reactive to light.  ?Cardiovascular:  ?  Rate and Rhythm: Normal rate and regular rhythm.  ?   Pulses: Normal pulses.  ?   Heart sounds: No murmur heard. ?  No friction rub. No gallop.  ?Pulmonary:  ?   Effort: Pulmonary effort is normal. No respiratory distress.  ?   Breath sounds: Normal breath sounds. No stridor.  ?Abdominal:  ?   General: Abdomen is flat.  ?Musculoskeletal:     ?   General: No swelling.  ?Skin: ?   General: Skin is warm.  ?Neurological:  ?   General: No focal deficit present.  ?   Mental Status: She is alert and oriented to person, place, and time. Mental status is at baseline.  ?Psychiatric:     ?   Mood and Affect: Mood  normal.     ?   Thought Content: Thought content normal.     ?   Judgment: Judgment normal.  ?  ?  ?  ?Assessment and Plan: ?Diagnoses and all orders for this visit: ?  ?Symptomatic cholelithiasis ?  ?  ?Alejandra Riley is a 51 y.o. female  ?  ?1.  We will proceed to the OR for a lap cholecystectomy. ?2. All risks and benefits were discussed with the patient to generally include: infection, bleeding, possible need for post op ERCP, damage to the bile ducts, and bile leak. Alternatives were offered and described.  All questions were answered and the patient voiced understanding of the procedure and wishes to proceed at this point with a laparoscopic cholecystectomy ?  ?  ?  ?  ?  ?No follow-ups on file. ?  ?Axel Filler, MD, FACS ?[facility], [place] ?General & Minimally Invasive Surgery ?  ? ?

## 2021-10-16 NOTE — Progress Notes (Signed)
? ?Assessment & Plan:  ?1. Migraine without aura and without status migrainosus, not intractable ?Well controlled on current regimen.  ?- Rimegepant Sulfate (NURTEC) 75 MG TBDP; Take 75 mg by mouth daily as needed. Do not repeat for 48 hours.  Dispense: 16 tablet; Refill: 2 ?- CBC with Differential/Platelet ?- CMP14+EGFR ? ?2. Prediabetes ?- Bayer DCA Hb A1c Waived ? ?3. Hypokalemia ?- CMP14+EGFR ? ?4. Tobacco use ?Encouraged smoking cessation. ?- CBC with Differential/Platelet ?- CMP14+EGFR ?- Lipid panel ? ? ?Return in about 6 months (around 04/18/2022) for annual physical. ? ?[name], MSN, APRN, FNP-C ?[place] ? ?Subjective:  ? ? Patient ID: Alejandra Riley, female    DOB: 02-18-1971, 51 y.o.   MRN: [id number] ? ?Patient Care Team: ?Loman Brooklyn, FNP as PCP - General (Family Medicine)  ? ?Chief Complaint:  ?Chief Complaint  ?Patient presents with  ? Medical Management of Chronic Issues  ? ? ?HPI: ?Alejandra Riley is a 51 y.o. female presenting on 10/16/2021 for Medical Management of Chronic Issues ? ?Migraines: Patient has a history of migraines ~1/month which are normally relieved with Ibuprofen. Sometimes they do occasionally last for a day or two during which time the Ibuprofen does not work. She had a prescription for Imitrex given to her, but she never took it due to the side effect of going blind. She has failed treatment with Maxalt. Nurtec was most recently prescribed which she reports works great. She is in need of a refill today. ? ?Prediabetes: diagnosed in October 2022 with an A1c of 6.2. ? ?New complaints: ?None ? ? ?Social history: ? ?Relevant past medical, surgical, family and social history reviewed and updated as indicated. Interim medical history since our last visit reviewed. ? ?Allergies and medications reviewed and updated. ? ?DATA REVIEWED: CHART IN EPIC ? ?ROS: Negative unless specifically indicated above in HPI.  ? ? ?Current Outpatient Medications:  ?   Potassium 99 MG TABS, Take 2 tablets by mouth daily., Disp: , Rfl:  ?  Rimegepant Sulfate (NURTEC) 75 MG TBDP, Take 75 mg by mouth daily as needed. Do not repeat for 48 hours., Disp: 16 tablet, Rfl: 2  ? ?Allergies  ?Allergen Reactions  ? Amoxicillin Hives  ? Penicillins Hives and Other (See Comments)  ? ?Past Medical History:  ?Diagnosis Date  ? Left breast mass 09/20/2017  ? Benign  ? Migraines   ? Prediabetes 05/21/2021  ?  ?Past Surgical History:  ?Procedure Laterality Date  ? TUBAL LIGATION  1999  ?  ?Social History  ? ?Socioeconomic History  ? Marital status: Significant Other  ?  Spouse name: Juanda Crumble  ? Number of children: 3  ? Years of education: Not on file  ? Highest education level: Not on file  ?Occupational History  ? Not on file  ?Tobacco Use  ? Smoking status: Every Day  ?  Packs/day: 1.50  ?  Types: Cigarettes  ?  Start date: 2012  ? Smokeless tobacco: Never  ?Vaping Use  ? Vaping Use: Never used  ?Substance and Sexual Activity  ? Alcohol use: Not Currently  ? Drug use: Never  ? Sexual activity: Not on file  ?Other Topics Concern  ? Not on file  ?Social History Narrative  ? Lives at home with fiance and youngest son   ? ?Social Determinants of Health  ? ?Financial Resource Strain: Not on file  ?Food Insecurity: Not on file  ?Transportation Needs: Not on file  ?Physical Activity: Not on file  ?  Stress: Not on file  ?Social Connections: Not on file  ?Intimate Partner Violence: Not on file  ?  ? ?   ?Objective:  ?  ?BP 134/83   Pulse 67   Temp (!) 97.5 ?F (36.4 ?C) (Temporal)   Ht _0  (1.676 m)   Wt 175 lb (79.4 kg)   LMP 05/20/2017 Comment: tubal ligation  SpO2 99%   BMI 28.25 kg/m?  ? ?Wt Readings from Last 3 Encounters:  ?10/16/21 175 lb (79.4 kg)  ?05/21/21 166 lb 6.4 oz (75.5 kg)  ?02/18/21 162 lb (73.5 kg)  ? ? ?Physical Exam ?Vitals reviewed.  ?Constitutional:   ?   General: She is not in acute distress. ?   Appearance: Normal appearance. She is overweight. She is not ill-appearing,  toxic-appearing or diaphoretic.  ?HENT:  ?   Head: Normocephalic and atraumatic.  ?Eyes:  ?   General: No scleral icterus.    ?   Right eye: No discharge.     ?   Left eye: No discharge.  ?   Conjunctiva/sclera: Conjunctivae normal.  ?Cardiovascular:  ?   Rate and Rhythm: Normal rate.  ?Pulmonary:  ?   Effort: Pulmonary effort is normal. No respiratory distress.  ?Musculoskeletal:     ?   General: Normal range of motion.  ?   Cervical back: Normal range of motion.  ?Skin: ?   General: Skin is warm and dry.  ?   Capillary Refill: Capillary refill takes less than 2 seconds.  ?Neurological:  ?   General: No focal deficit present.  ?   Mental Status: She is alert and oriented to person, place, and time. Mental status is at baseline.  ?Psychiatric:     ?   Mood and Affect: Mood normal.     ?   Behavior: Behavior normal.     ?   Thought Content: Thought content normal.     ?   Judgment: Judgment normal.  ? ? ?Lab Results  ?Component Value Date  ? TSH 1.869 12/09/2020  ? ?Lab Results  ?Component Value Date  ? WBC 11.6 (H) 12/09/2020  ? HGB 10.4 (L) 12/09/2020  ? HCT 32.1 (L) 12/09/2020  ? MCV 81.9 12/09/2020  ? PLT 318 12/09/2020  ? ?Lab Results  ?Component Value Date  ? NA 142 02/18/2021  ? K 3.5 02/18/2021  ? CO2 29 02/18/2021  ? GLUCOSE 114 (H) 02/18/2021  ? BUN 4 (L) 02/18/2021  ? CREATININE 0.85 02/18/2021  ? BILITOT 0.4 12/12/2020  ? ALKPHOS 54 12/12/2020  ? AST 55 (H) 12/12/2020  ? ALT 40 12/12/2020  ? PROT 5.1 (L) 12/12/2020  ? ALBUMIN 2.5 (L) 12/12/2020  ? CALCIUM 10.1 02/18/2021  ? ANIONGAP 6 12/12/2020  ? EGFR 84 02/18/2021  ? ?No results found for: CHOL ?No results found for: HDL ?No results found for: [place] ?No results found for: TRIG ?No results found for: CHOLHDL ?Lab Results  ?Component Value Date  ? HGBA1C 6.2 (H) 05/21/2021  ? ? ?   ? ? ? ? ? ?

## 2021-10-17 LAB — CMP14+EGFR
ALT: 9 IU/L (ref 0–32)
AST: 13 IU/L (ref 0–40)
Albumin/Globulin Ratio: 1.4 (ref 1.2–2.2)
Albumin: 4 g/dL (ref 3.8–4.8)
Alkaline Phosphatase: 120 IU/L (ref 44–121)
BUN/Creatinine Ratio: 6 — ABNORMAL LOW (ref 9–23)
BUN: 5 mg/dL — ABNORMAL LOW (ref 6–24)
Bilirubin Total: 0.2 mg/dL (ref 0.0–1.2)
CO2: 24 mmol/L (ref 20–29)
Calcium: 9.7 mg/dL (ref 8.7–10.2)
Chloride: 103 mmol/L (ref 96–106)
Creatinine, Ser: 0.86 mg/dL (ref 0.57–1.00)
Globulin, Total: 2.9 g/dL (ref 1.5–4.5)
Glucose: 137 mg/dL — ABNORMAL HIGH (ref 70–99)
Potassium: 4.2 mmol/L (ref 3.5–5.2)
Sodium: 142 mmol/L (ref 134–144)
Total Protein: 6.9 g/dL (ref 6.0–8.5)
eGFR: 82 mL/min/{1.73_m2} (ref >59)

## 2021-10-17 LAB — LIPID PANEL
Chol/HDL Ratio: 5.6 ratio — ABNORMAL HIGH (ref 0.0–4.4)
Cholesterol, Total: 200 mg/dL — ABNORMAL HIGH (ref 100–199)
HDL: 36 mg/dL — ABNORMAL LOW (ref >39)
LDL Chol Calc (NIH): 118 mg/dL — ABNORMAL HIGH (ref 0–99)
Triglycerides: 262 mg/dL — ABNORMAL HIGH (ref 0–149)
VLDL Cholesterol Cal: 46 mg/dL — ABNORMAL HIGH (ref 5–40)

## 2021-10-17 LAB — CBC WITH DIFFERENTIAL/PLATELET
Basophils Absolute: 0.1 10*3/uL (ref 0.0–0.2)
Basos: 1 %
EOS (ABSOLUTE): 0.2 10*3/uL (ref 0.0–0.4)
Eos: 2 %
Hematocrit: 37.6 % (ref 34.0–46.6)
Hemoglobin: 12.3 g/dL (ref 11.1–15.9)
Immature Grans (Abs): 0 10*3/uL (ref 0.0–0.1)
Immature Granulocytes: 0 %
Lymphocytes Absolute: 2.7 10*3/uL (ref 0.7–3.1)
Lymphs: 25 %
MCH: 26.3 pg — ABNORMAL LOW (ref 26.6–33.0)
MCHC: 32.7 g/dL (ref 31.5–35.7)
MCV: 80 fL (ref 79–97)
Monocytes Absolute: 0.9 10*3/uL (ref 0.1–0.9)
Monocytes: 8 %
Neutrophils Absolute: 6.8 10*3/uL (ref 1.4–7.0)
Neutrophils: 64 %
Platelets: 372 10*3/uL (ref 150–450)
RBC: 4.68 x10E6/uL (ref 3.77–5.28)
RDW: 13.3 % (ref 11.7–15.4)
WBC: 10.7 10*3/uL (ref 3.4–10.8)

## 2021-10-19 ENCOUNTER — Encounter: Payer: Self-pay | Admitting: Family Medicine

## 2021-10-19 LAB — BAYER DCA HB A1C WAIVED: HB A1C (BAYER DCA - WAIVED): 6.5 % — ABNORMAL HIGH (ref 4.8–5.6)

## 2021-10-20 ENCOUNTER — Ambulatory Visit: Payer: 59 | Admitting: Family Medicine

## 2021-10-21 ENCOUNTER — Telehealth: Payer: Self-pay | Admitting: Family Medicine

## 2021-10-21 DIAGNOSIS — G43009 Migraine without aura, not intractable, without status migrainosus: Secondary | ICD-10-CM

## 2021-10-21 NOTE — Telephone Encounter (Signed)
Pt says that insurance requires a PA on Rimegepant Sulfate (NURTEC) 75 MG TBDP.  ?Pt seen that her A1C is high and asking should she be on medication for this please call back. ?Use [facility]  ?

## 2021-10-22 NOTE — Telephone Encounter (Signed)
PA has not been started- patient will be contacted when it is complete ? ?Please advise on medication  ?

## 2021-10-23 MED ORDER — FROVATRIPTAN SUCCINATE 2.5 MG PO TABS
2.5000 mg | ORAL_TABLET | ORAL | 0 refills | Status: DC | PRN
Start: 2021-10-23 — End: 2022-09-29

## 2021-10-23 NOTE — Telephone Encounter (Signed)
Patient aware and would like something else sent in for her headaches. States nurtec is not covered and would like something else sent in.  Looked in [place] print outs a and I do not have a PA for it.  She states that she would just rather have her medication changed  ?

## 2021-10-23 NOTE — Telephone Encounter (Signed)
Patient aware.

## 2021-10-23 NOTE — Telephone Encounter (Signed)
D/C'd Nurtec. Rx'd Frova. ?

## 2021-10-23 NOTE — Telephone Encounter (Signed)
She does not need medication at this time but needs to limit sweets, carbs, and sodas. Goal A1c is <7. ?

## 2021-12-19 IMAGING — DX DG KNEE COMPLETE 4+V*L*
4 series · 4 of 4 positions shown · non-contrast
Comparison: None.

CLINICAL DATA: Anterior knee pain after fall

EXAM:
LEFT KNEE - COMPLETE 4+ VIEW

[knee ap]
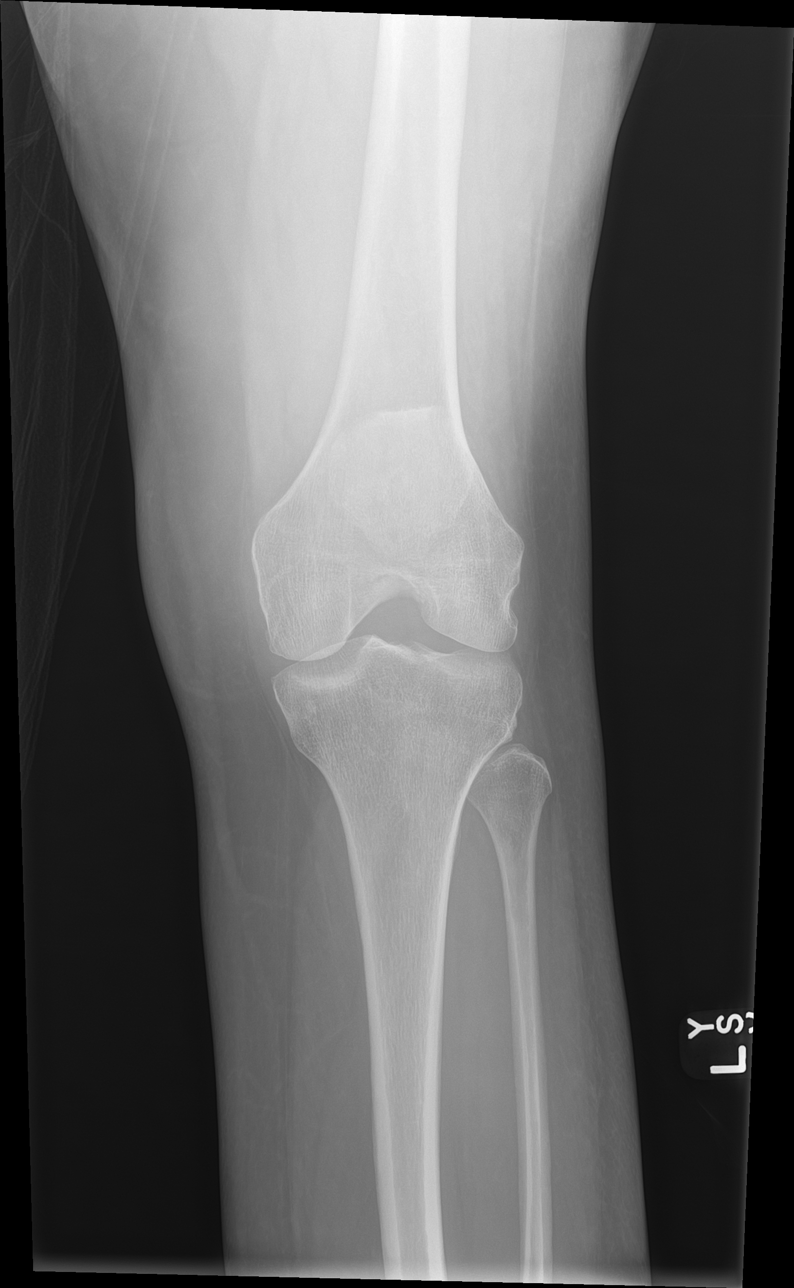

[knee obl (1 of 2)]
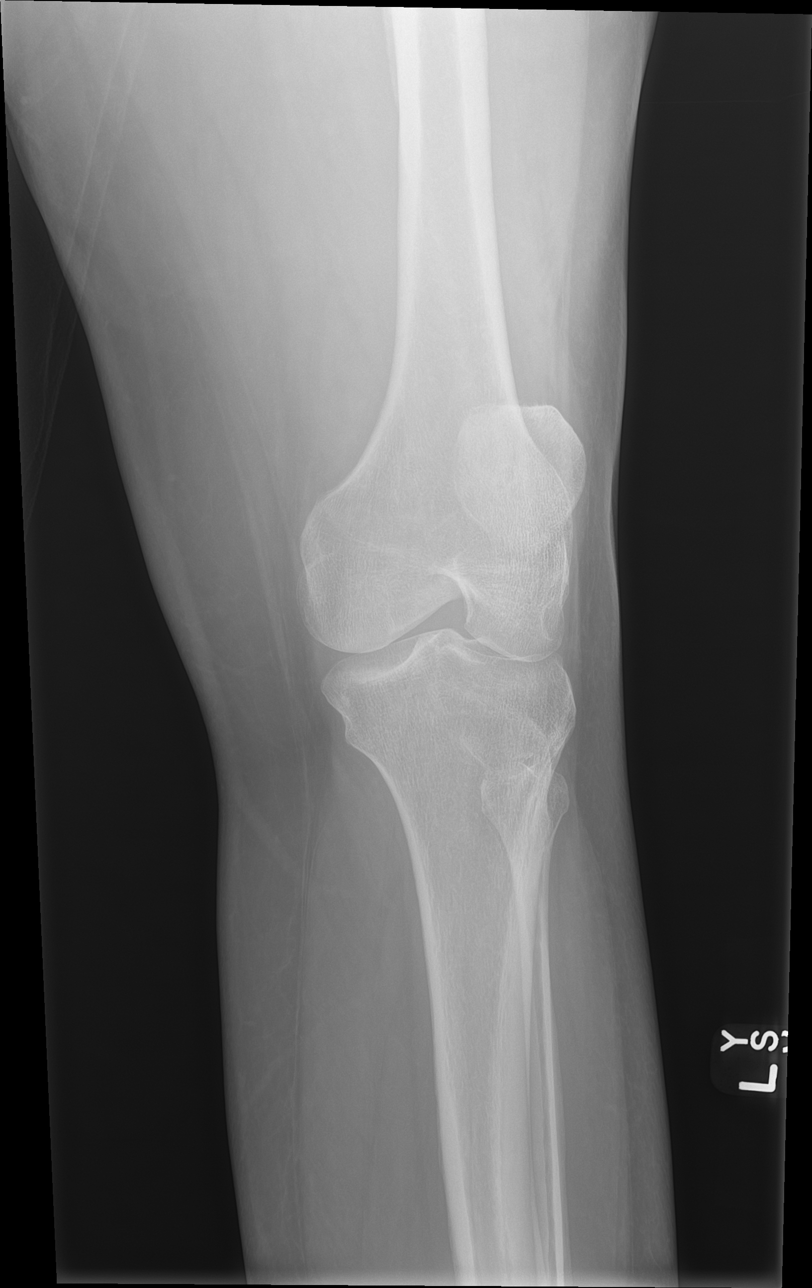

[knee obl (2 of 2)]
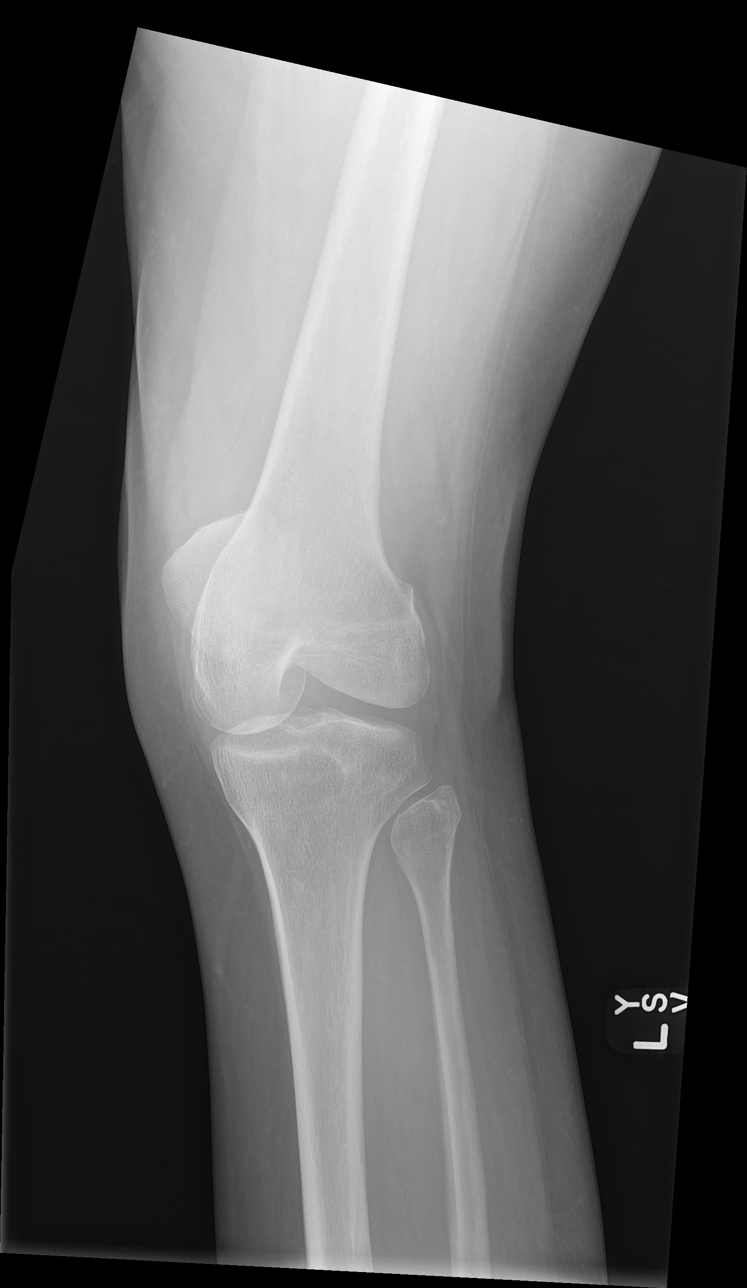

[knee lat]
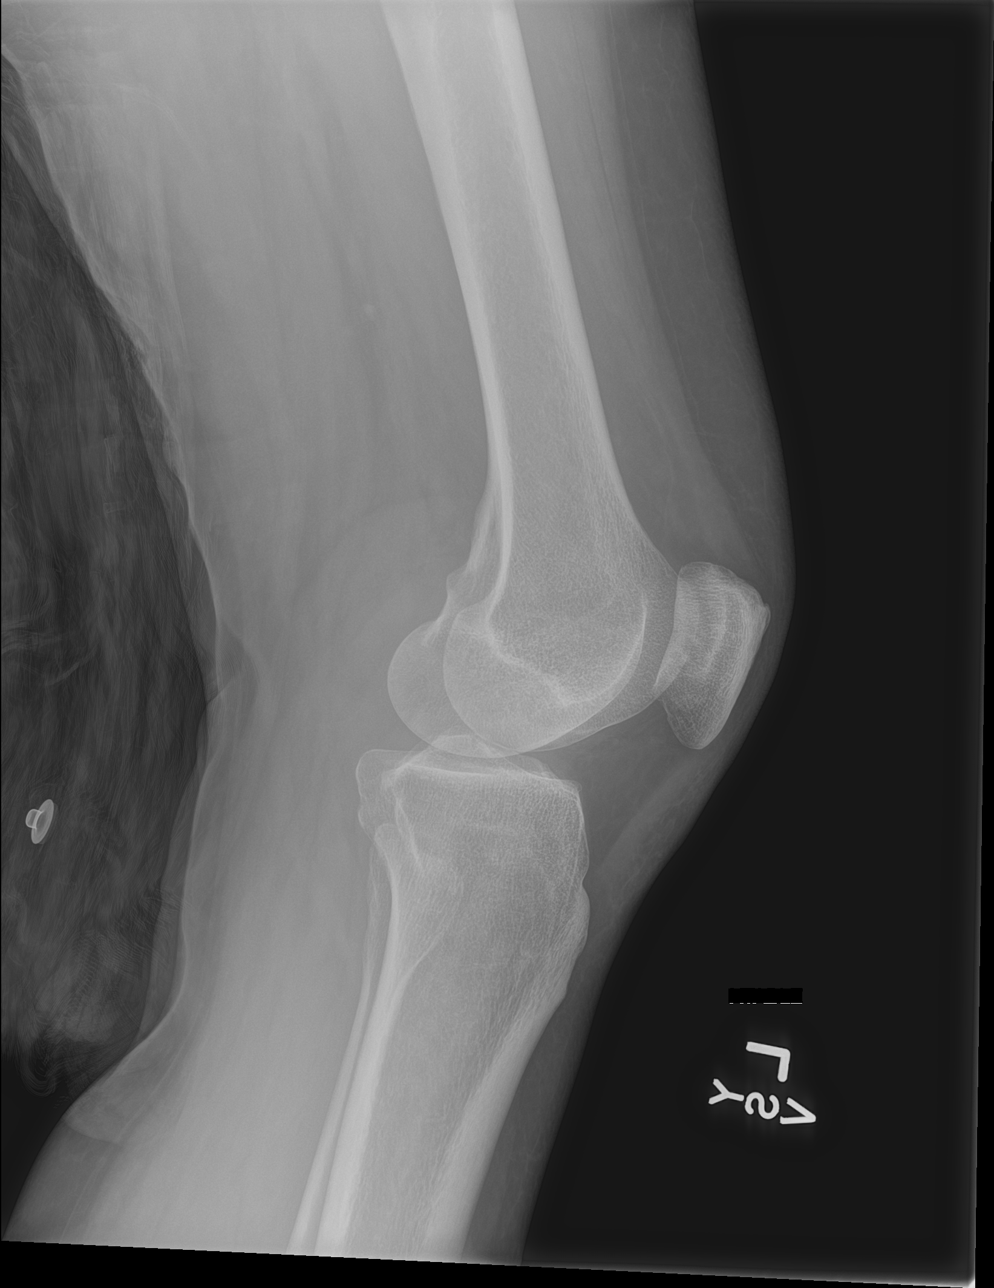

[4 of 4 positions shown; findings below may reference images not displayed]

FINDINGS: No evidence of fracture, dislocation, or joint effusion. No evidence
of arthropathy or other focal bone abnormality. Soft tissues are
unremarkable.
IMPRESSION: No acute osseous abnormality.

## 2022-04-20 ENCOUNTER — Encounter: Payer: 59 | Admitting: Family Medicine

## 2022-04-20 ENCOUNTER — Encounter: Payer: Self-pay | Admitting: Family Medicine

## 2022-04-20 DIAGNOSIS — Z0000 Encounter for general adult medical examination without abnormal findings: Secondary | ICD-10-CM

## 2022-04-20 DIAGNOSIS — E118 Type 2 diabetes mellitus with unspecified complications: Secondary | ICD-10-CM

## 2022-08-31 ENCOUNTER — Encounter: Payer: Self-pay | Admitting: Family Medicine

## 2022-09-01 ENCOUNTER — Telehealth: Payer: Self-pay | Admitting: Family Medicine

## 2022-09-02 NOTE — Telephone Encounter (Signed)
PATIENT OPTED TO TAKE APPOINTMENT TOMORROW WITH [facility]

## 2022-09-02 NOTE — Telephone Encounter (Signed)
NTBS first. She can be worked in on a DOD day in a 30 minute slot or can see Alvie Heidelberg to establish if she prefers as I have not seen her before.

## 2022-09-02 NOTE — Telephone Encounter (Signed)
Pt states she has been on potassium chloride 20meq (take 3 po daily) that the ER had given her with refills and she ran out yesterday. I don't see it on her med list and her appt with [place] is 09/29/22 which is the soonest we can get her in unless you want me to put her on DOD day. Ok to send in rx or does she ntbs first?    FYI-she had labs 09/2021 which showed she was diabetic and she never followed up.

## 2022-09-03 ENCOUNTER — Ambulatory Visit: Payer: Self-pay | Admitting: Family Medicine

## 2022-09-29 ENCOUNTER — Ambulatory Visit: Payer: Self-pay | Admitting: Family Medicine

## 2022-09-29 ENCOUNTER — Ambulatory Visit (INDEPENDENT_AMBULATORY_CARE_PROVIDER_SITE_OTHER): Payer: Managed Care, Other (non HMO) | Admitting: Family Medicine

## 2022-09-29 ENCOUNTER — Encounter: Payer: Self-pay | Admitting: Family Medicine

## 2022-09-29 VITALS — BP 125/80 | HR 102 | Temp 98.1°F | Ht 66.0 in | Wt 175.5 lb

## 2022-09-29 DIAGNOSIS — G43009 Migraine without aura, not intractable, without status migrainosus: Secondary | ICD-10-CM

## 2022-09-29 DIAGNOSIS — E785 Hyperlipidemia, unspecified: Secondary | ICD-10-CM

## 2022-09-29 DIAGNOSIS — E876 Hypokalemia: Secondary | ICD-10-CM | POA: Diagnosis not present

## 2022-09-29 DIAGNOSIS — E1169 Type 2 diabetes mellitus with other specified complication: Secondary | ICD-10-CM | POA: Diagnosis not present

## 2022-09-29 DIAGNOSIS — E1165 Type 2 diabetes mellitus with hyperglycemia: Secondary | ICD-10-CM | POA: Diagnosis not present

## 2022-09-29 DIAGNOSIS — E663 Overweight: Secondary | ICD-10-CM

## 2022-09-29 DIAGNOSIS — R221 Localized swelling, mass and lump, neck: Secondary | ICD-10-CM

## 2022-09-29 MED ORDER — SUMATRIPTAN SUCCINATE 50 MG PO TABS: ORAL_TABLET | ORAL | 6 refills | Status: DC

## 2022-09-29 MED ORDER — LISINOPRIL 2.5 MG PO TABS: 2.5000 mg | ORAL_TABLET | Freq: Every day | ORAL | 3 refills | Status: DC

## 2022-09-29 NOTE — Patient Instructions (Addendum)
Our records indicate that you are due for your screening mammogram.  Please make appointment for the mobile unit at check out or call out office to schedule.

## 2022-09-29 NOTE — Progress Notes (Signed)
Established Patient Office Visit  Subjective   Patient ID: Alejandra Riley, female    DOB: 12-07-1970  Age: 52 y.o. MRN: [id number]  Chief Complaint  Patient presents with   Medical Management of Chronic Issues   Prediabetes   Migraine    HPI  T2DM Pt presents for initial evaluation of Type 2 diabetes mellitus. Patient denies foot ulcerations, increased appetite, nausea, paresthesia of the feet, polydipsia, polyuria, visual disturbances, vomiting, and weight loss.  Current diabetic medications include: none  Current monitoring regimen: none  Is She on ACE inhibitor or angiotensin II receptor blocker?  No Is She on statin? No   2. HLD Not on statin. Regular diet, no exericse.   3. Migraines She is having migraines 2-3x a week for the last month. HA and unilateral with sensitivity to light and nosie. She sometimes has nausea. Denies vomiting or aura. She has been taking frova but his has not been helping. She has not tried other medications for migraines.   4. Hypokalemia She has not had the supplement for the last month. Was on 30 meq daily. This was started after a hospitalization.   5. Neck lump She has a lump under her right ear x 6 months. No changes in size. It is tender. Denies ear pain, sore throat, or trouble swallowing. Denies erythema, warmth, or exudate.   Past Medical History:  Diagnosis Date   Left breast mass 09/20/2017   Benign   Migraines    Prediabetes 05/21/2021      ROS As per HPI.    Objective:     BP 125/80   Pulse (!) 102   Temp 98.1 F (36.7 C) (Temporal)   Ht '5\' 6"'$  (1.676 m)   Wt 175 lb 8 oz (79.6 kg)   LMP 05/20/2017 Comment: tubal ligation  SpO2 97%   BMI 28.33 kg/m    Physical Exam Vitals and nursing note reviewed.  Constitutional:      General: She is not in acute distress.    Appearance: Normal appearance. She is not ill-appearing, toxic-appearing or diaphoretic.  HENT:     Right Ear: Tympanic membrane, ear canal and  external ear normal.     Left Ear: Tympanic membrane, ear canal and external ear normal.     Nose: Nose normal.     Mouth/Throat:     Mouth: Mucous membranes are moist.     Pharynx: Oropharynx is clear.     Tonsils: No tonsillar exudate or tonsillar abscesses. 1+ on the right. 1+ on the left.  Neck:     Thyroid: No thyroid mass, thyromegaly or thyroid tenderness.     Comments: Right submandibular mobile mass measure 5 cm x 3 cm.  Cardiovascular:     Rate and Rhythm: Normal rate and regular rhythm.     Heart sounds: Normal heart sounds. No murmur heard. Pulmonary:     Effort: Pulmonary effort is normal. No respiratory distress.     Breath sounds: Normal breath sounds.  Musculoskeletal:     Cervical back: Normal range of motion and neck supple. No edema or erythema. Normal range of motion.     Right lower leg: No edema.     Left lower leg: No edema.  Skin:    General: Skin is warm.  Neurological:     General: No focal deficit present.     Mental Status: She is alert and oriented to person, place, and time.  Psychiatric:        Mood  and Affect: Mood normal.        Behavior: Behavior normal.      No results found for any visits on 09/29/22.    The 10-year ASCVD risk score (Arnett DK, et al., 2019) is: 5.9%    Assessment & Plan:   Alejandra Riley was seen today for medical management of chronic issues, prediabetes and migraine.  Diagnoses and all orders for this visit:  Type 2 diabetes mellitus with hyperglycemia, without long-term current use of insulin (HCC) A1c pending. Last A1c was 6.5 nearly a year ago. Discussed metformin if A1c is not at goal. She is agreeable to start lisinopril and statin. Will do foot exam and urine micro at next visit.  -     lisinopril (ZESTRIL) 2.5 MG tablet; Take 1 tablet (2.5 mg total) by mouth daily. -     CMP14+EGFR -     CBC with Differential/Platelet -     Lipid panel  Hyperlipidemia associated with type 2 diabetes mellitus (HCC) Fasting  lipid panel pending. Discussed statin pending labs.  -     Lipid panel  Hypokalemia No supplement for last month. Labs pending.  -     CMP14+EGFR  Migraine without aura and without status migrainosus, not intractable Uncontrolled with frova. Try imitrex as below.  -     SUMAtriptan (IMITREX) 50 MG tablet; Take one tablet by mouth at onset of migraine. May repeat in 2 hours if headache persists or recurs.  Overweight Labs pending.  -     CMP14+EGFR -     CBC with Differential/Platelet -     TSH  Neck mass Right submandibular x6 months. [place] discussed and ordered. Will notify of plan of care when results are available.  -     US Soft Tissue Head/Neck (NON-THYROID); Future   Return in about 3 months (around 12/30/2022) for chronic follow up.   The patient indicates understanding of these issues and agrees with the plan.  Alejandra Perking, FNP

## 2022-09-30 ENCOUNTER — Other Ambulatory Visit: Payer: Self-pay | Admitting: *Deleted

## 2022-09-30 ENCOUNTER — Other Ambulatory Visit: Payer: Self-pay | Admitting: Family Medicine

## 2022-09-30 DIAGNOSIS — E876 Hypokalemia: Secondary | ICD-10-CM

## 2022-09-30 DIAGNOSIS — D72829 Elevated white blood cell count, unspecified: Secondary | ICD-10-CM

## 2022-09-30 DIAGNOSIS — E1169 Type 2 diabetes mellitus with other specified complication: Secondary | ICD-10-CM

## 2022-09-30 LAB — CBC WITH DIFFERENTIAL/PLATELET
Basophils Absolute: 0.1 10*3/uL (ref 0.0–0.2)
Basos: 1 %
EOS (ABSOLUTE): 0.2 10*3/uL (ref 0.0–0.4)
Eos: 2 %
Hematocrit: 39.9 % (ref 34.0–46.6)
Hemoglobin: 12.8 g/dL (ref 11.1–15.9)
Immature Grans (Abs): 0 10*3/uL (ref 0.0–0.1)
Immature Granulocytes: 0 %
Lymphocytes Absolute: 3 10*3/uL (ref 0.7–3.1)
Lymphs: 25 %
MCH: 25.2 pg — ABNORMAL LOW (ref 26.6–33.0)
MCHC: 32.1 g/dL (ref 31.5–35.7)
MCV: 79 fL (ref 79–97)
Monocytes Absolute: 0.9 10*3/uL (ref 0.1–0.9)
Monocytes: 7 %
Neutrophils Absolute: 7.8 10*3/uL — ABNORMAL HIGH (ref 1.4–7.0)
Neutrophils: 65 %
Platelets: 488 10*3/uL — ABNORMAL HIGH (ref 150–450)
RBC: 5.07 x10E6/uL (ref 3.77–5.28)
RDW: 13.3 % (ref 11.7–15.4)
WBC: 11.9 10*3/uL — ABNORMAL HIGH (ref 3.4–10.8)

## 2022-09-30 LAB — LIPID PANEL
Chol/HDL Ratio: 4.6 ratio — ABNORMAL HIGH (ref 0.0–4.4)
Cholesterol, Total: 188 mg/dL (ref 100–199)
HDL: 41 mg/dL (ref >39)
LDL Chol Calc (NIH): 120 mg/dL — ABNORMAL HIGH (ref 0–99)
Triglycerides: 152 mg/dL — ABNORMAL HIGH (ref 0–149)
VLDL Cholesterol Cal: 27 mg/dL (ref 5–40)

## 2022-09-30 LAB — CMP14+EGFR
ALT: 11 IU/L (ref 0–32)
AST: 15 IU/L (ref 0–40)
Albumin/Globulin Ratio: 1.4 (ref 1.2–2.2)
Albumin: 4.5 g/dL (ref 3.8–4.9)
Alkaline Phosphatase: 131 IU/L — ABNORMAL HIGH (ref 44–121)
BUN/Creatinine Ratio: 4 — ABNORMAL LOW (ref 9–23)
BUN: 3 mg/dL — ABNORMAL LOW (ref 6–24)
Bilirubin Total: 0.4 mg/dL (ref 0.0–1.2)
CO2: 28 mmol/L (ref 20–29)
Calcium: 10.1 mg/dL (ref 8.7–10.2)
Chloride: 96 mmol/L (ref 96–106)
Creatinine, Ser: 0.82 mg/dL (ref 0.57–1.00)
Globulin, Total: 3.2 g/dL (ref 1.5–4.5)
Glucose: 147 mg/dL — ABNORMAL HIGH (ref 70–99)
Potassium: 3.2 mmol/L — ABNORMAL LOW (ref 3.5–5.2)
Sodium: 141 mmol/L (ref 134–144)
Total Protein: 7.7 g/dL (ref 6.0–8.5)
eGFR: 87 mL/min/{1.73_m2} (ref >59)

## 2022-09-30 LAB — TSH: TSH: 1.14 u[IU]/mL (ref 0.450–4.500)

## 2022-09-30 MED ORDER — POTASSIUM CHLORIDE ER 10 MEQ PO TBCR: 30.0000 meq | EXTENDED_RELEASE_TABLET | Freq: Every day | ORAL | 1 refills | Status: DC

## 2022-09-30 MED ORDER — ROSUVASTATIN CALCIUM 10 MG PO TABS: 10.0000 mg | ORAL_TABLET | Freq: Every day | ORAL | 3 refills | Status: DC

## 2022-10-06 ENCOUNTER — Ambulatory Visit (HOSPITAL_COMMUNITY)
Admission: RE | Admit: 2022-10-06 | Discharge: 2022-10-06 | Disposition: A | Discharge status: Home / Self Care | Payer: Managed Care, Other (non HMO) | Source: Ambulatory Visit | Attending: Family Medicine | Admitting: Family Medicine

## 2022-10-06 DIAGNOSIS — R221 Localized swelling, mass and lump, neck: Secondary | ICD-10-CM | POA: Diagnosis present

## 2022-10-07 ENCOUNTER — Other Ambulatory Visit: Payer: Managed Care, Other (non HMO)

## 2022-10-07 ENCOUNTER — Telehealth: Payer: Self-pay | Admitting: *Deleted

## 2022-10-07 DIAGNOSIS — E876 Hypokalemia: Secondary | ICD-10-CM

## 2022-10-07 DIAGNOSIS — D72829 Elevated white blood cell count, unspecified: Secondary | ICD-10-CM

## 2022-10-07 NOTE — Telephone Encounter (Signed)
I called [phone] to initiate PA for sumatriptan but no PA is needed-paid claim 09/29/22.  Quantity limit on medication-only allows 18 tabs per 30 days or 54 tabs per 90 days.

## 2022-10-08 LAB — BMP8+EGFR
BUN/Creatinine Ratio: 6 — ABNORMAL LOW (ref 9–23)
BUN: 4 mg/dL — ABNORMAL LOW (ref 6–24)
CO2: 24 mmol/L (ref 20–29)
Calcium: 9.9 mg/dL (ref 8.7–10.2)
Chloride: 100 mmol/L (ref 96–106)
Creatinine, Ser: 0.72 mg/dL (ref 0.57–1.00)
Glucose: 110 mg/dL — ABNORMAL HIGH (ref 70–99)
Potassium: 4.4 mmol/L (ref 3.5–5.2)
Sodium: 140 mmol/L (ref 134–144)
eGFR: 101 mL/min/{1.73_m2} (ref >59)

## 2022-10-08 LAB — CBC WITH DIFFERENTIAL/PLATELET
Basophils Absolute: 0.1 10*3/uL (ref 0.0–0.2)
Basos: 1 %
EOS (ABSOLUTE): 0.2 10*3/uL (ref 0.0–0.4)
Eos: 2 %
Hematocrit: 37 % (ref 34.0–46.6)
Hemoglobin: 12 g/dL (ref 11.1–15.9)
Immature Grans (Abs): 0 10*3/uL (ref 0.0–0.1)
Immature Granulocytes: 0 %
Lymphocytes Absolute: 3.3 10*3/uL — ABNORMAL HIGH (ref 0.7–3.1)
Lymphs: 27 %
MCH: 25.4 pg — ABNORMAL LOW (ref 26.6–33.0)
MCHC: 32.4 g/dL (ref 31.5–35.7)
MCV: 78 fL — ABNORMAL LOW (ref 79–97)
Monocytes Absolute: 0.8 10*3/uL (ref 0.1–0.9)
Monocytes: 7 %
Neutrophils Absolute: 7.7 10*3/uL — ABNORMAL HIGH (ref 1.4–7.0)
Neutrophils: 63 %
Platelets: 470 10*3/uL — ABNORMAL HIGH (ref 150–450)
RBC: 4.72 x10E6/uL (ref 3.77–5.28)
RDW: 13.4 % (ref 11.7–15.4)
WBC: 12.1 10*3/uL — ABNORMAL HIGH (ref 3.4–10.8)

## 2022-10-11 ENCOUNTER — Other Ambulatory Visit: Payer: Self-pay | Admitting: Family Medicine

## 2022-10-11 DIAGNOSIS — R221 Localized swelling, mass and lump, neck: Secondary | ICD-10-CM

## 2022-12-06 ENCOUNTER — Ambulatory Visit (HOSPITAL_COMMUNITY)
Admission: RE | Admit: 2022-12-06 | Discharge: 2022-12-06 | Disposition: A | Discharge status: Home / Self Care | Payer: Managed Care, Other (non HMO) | Source: Ambulatory Visit | Attending: Family Medicine | Admitting: Family Medicine

## 2022-12-06 DIAGNOSIS — R221 Localized swelling, mass and lump, neck: Secondary | ICD-10-CM | POA: Insufficient documentation

## 2022-12-06 LAB — POCT I-STAT CREATININE: Creatinine, Ser: 0.7 mg/dL (ref 0.44–1.00)

## 2022-12-06 MED ORDER — IOHEXOL 300 MG/ML  SOLN
75.0000 mL | Freq: Once | INTRAMUSCULAR | Status: AC | PRN
  Administered 2022-12-06: 75 mL via INTRAVENOUS

## 2022-12-09 ENCOUNTER — Telehealth: Payer: Self-pay | Admitting: Family Medicine

## 2022-12-09 NOTE — Telephone Encounter (Signed)
Patient aware ct scan has not resulted yet

## 2022-12-14 ENCOUNTER — Telehealth: Payer: Self-pay | Admitting: Family Medicine

## 2022-12-14 ENCOUNTER — Other Ambulatory Visit: Payer: Self-pay | Admitting: Family Medicine

## 2022-12-14 DIAGNOSIS — K118 Other diseases of salivary glands: Secondary | ICD-10-CM

## 2022-12-14 NOTE — Telephone Encounter (Signed)
PATIENT HAS NO PREFERENCE WHERE YOU REFER HER

## 2022-12-15 ENCOUNTER — Other Ambulatory Visit: Payer: Self-pay

## 2022-12-15 DIAGNOSIS — Z1231 Encounter for screening mammogram for malignant neoplasm of breast: Secondary | ICD-10-CM

## 2022-12-26 ENCOUNTER — Encounter: Payer: Self-pay | Admitting: Family Medicine

## 2022-12-31 ENCOUNTER — Ambulatory Visit: Payer: Managed Care, Other (non HMO) | Admitting: Family Medicine

## 2023-01-19 ENCOUNTER — Ambulatory Visit
Admission: RE | Admit: 2023-01-19 | Discharge: 2023-01-19 | Disposition: A | Discharge status: Home / Self Care | Payer: Managed Care, Other (non HMO) | Source: Ambulatory Visit | Attending: Family Medicine | Admitting: Family Medicine

## 2023-01-19 DIAGNOSIS — Z1231 Encounter for screening mammogram for malignant neoplasm of breast: Secondary | ICD-10-CM

## 2023-01-21 ENCOUNTER — Other Ambulatory Visit: Payer: Self-pay | Admitting: Otolaryngology

## 2023-01-22 ENCOUNTER — Other Ambulatory Visit: Payer: Self-pay | Admitting: Family Medicine

## 2023-01-22 DIAGNOSIS — G43009 Migraine without aura, not intractable, without status migrainosus: Secondary | ICD-10-CM

## 2023-01-24 ENCOUNTER — Encounter: Payer: Self-pay | Admitting: Family Medicine

## 2023-01-24 ENCOUNTER — Ambulatory Visit (INDEPENDENT_AMBULATORY_CARE_PROVIDER_SITE_OTHER): Payer: Managed Care, Other (non HMO) | Admitting: Family Medicine

## 2023-01-24 VITALS — BP 123/72 | HR 69 | Temp 98.6°F | Ht 66.0 in | Wt 173.0 lb

## 2023-01-24 DIAGNOSIS — G43009 Migraine without aura, not intractable, without status migrainosus: Secondary | ICD-10-CM | POA: Diagnosis not present

## 2023-01-24 DIAGNOSIS — E1165 Type 2 diabetes mellitus with hyperglycemia: Secondary | ICD-10-CM

## 2023-01-24 DIAGNOSIS — K118 Other diseases of salivary glands: Secondary | ICD-10-CM

## 2023-01-24 DIAGNOSIS — E785 Hyperlipidemia, unspecified: Secondary | ICD-10-CM

## 2023-01-24 DIAGNOSIS — E1169 Type 2 diabetes mellitus with other specified complication: Secondary | ICD-10-CM | POA: Diagnosis not present

## 2023-01-24 DIAGNOSIS — E876 Hypokalemia: Secondary | ICD-10-CM

## 2023-01-24 LAB — BAYER DCA HB A1C WAIVED: HB A1C (BAYER DCA - WAIVED): 7.4 % — ABNORMAL HIGH (ref 4.8–5.6)

## 2023-01-24 MED ORDER — NURTEC 75 MG PO TBDP
75.0000 mg | ORAL_TABLET | Freq: Every day | ORAL | 6 refills | Status: DC | PRN
Start: 2023-01-24 — End: 2023-08-08

## 2023-01-24 MED ORDER — SEMAGLUTIDE(0.25 OR 0.5MG/DOS) 2 MG/3ML ~~LOC~~ SOPN
PEN_INJECTOR | SUBCUTANEOUS | 3 refills | Status: DC
Start: 2023-01-24 — End: 2023-04-08

## 2023-01-24 NOTE — Progress Notes (Signed)
Established Patient Office Visit  Subjective   Patient ID: Alejandra Riley, female    DOB: 1970-08-12  Age: 52 y.o. MRN: [id number]  Chief Complaint  Patient presents with   Medical Management of Chronic Issues    3 month   Obesity    HPI  T2DM Pt presents for initial evaluation of Type 2 diabetes mellitus. Patient denies foot ulcerations, increased appetite, nausea, paresthesia of the feet, polydipsia, polyuria, visual disturbances, vomiting, and weight loss.  Current diabetic medications include: none.   Current monitoring regimen: none  Is She on ACE inhibitor or angiotensin II receptor blocker?  No Is She on statin? No   2. HLD She started crestor and has been compliant. Denies side effects.   3. Migraines She is having migraines once a week for the last month. HA and unilateral with sensitivity to light and nosie. She sometimes has nausea. Denies vomiting or aura. She has been taking frova but this was not effective. Imitrex causes arm weakness and significant drowsiness. She has also failed excedrin migraine, tylenol, advil.   4. Hypokalemia Currently on 30 meq daily.   5. Parotid mass ENT has surgery scheduled for removal next month. Likely benign, plan for biopsty.   Past Medical History:  Diagnosis Date   Left breast mass 09/20/2017   Benign   Migraines    Prediabetes 05/21/2021      ROS As per HPI.    Objective:     Pulse 69   Temp 98.6 F (37 C)   Ht 5\' 6"  (1.676 m)   Wt 173 lb (78.5 kg)   LMP 05/20/2017 Comment: tubal ligation  SpO2 96%   BMI 27.92 kg/m    Physical Exam Vitals and nursing note reviewed.  Constitutional:      General: She is not in acute distress.    Appearance: Normal appearance. She is not ill-appearing, toxic-appearing or diaphoretic.  HENT:     Mouth/Throat:     Tonsils: No tonsillar exudate or tonsillar abscesses. 1+ on the right. 1+ on the left.  Neck:     Thyroid: No thyroid mass, thyromegaly or thyroid  tenderness.  Cardiovascular:     Rate and Rhythm: Normal rate and regular rhythm.     Heart sounds: Normal heart sounds. No murmur heard. Pulmonary:     Effort: Pulmonary effort is normal. No respiratory distress.     Breath sounds: Normal breath sounds.  Musculoskeletal:     Cervical back: No edema, erythema or rigidity. Normal range of motion.     Right lower leg: No edema.     Left lower leg: No edema.  Skin:    General: Skin is warm.  Neurological:     General: No focal deficit present.     Mental Status: She is alert and oriented to person, place, and time.  Psychiatric:        Mood and Affect: Mood normal.        Behavior: Behavior normal.      No results found for any visits on 01/24/23.    The 10-year ASCVD risk score (Arnett DK, et al., 2019) is: 8.8%    Assessment & Plan:   Alejandra Riley was seen today for medical management of chronic issues and obesity.  Diagnoses and all orders for this visit:  Type 2 diabetes mellitus with hyperglycemia, without long-term current use of insulin (HCC) A1c 7.4 today, not at goal of <7. Medication changes today: start ozempic. She is on an ACE/ARB and  statin. Diet and exercise.  -     Bayer DCA Hb A1c Waived -     Microalbumin / creatinine urine ratio -     CBC with Differential/Platelet -     CMP14+EGFR -     Semaglutide,0.25 or 0.5MG /DOS, 2 MG/3ML SOPN; Inject 0.25 mg into the skin weekly for the next 4 weeks. After 4 weeks, increase to 0.5 mg weekly.  Hyperlipidemia associated with type 2 diabetes mellitus (HCC) Repeat lipid panel since starting statin.  -     Lipid panel  Migraine without aura and without status migrainosus, not intractable Failed frova, imitrex, advil, Excedrin, tylenol. Nurtec ordered as below.  -     Rimegepant Sulfate (NURTEC) 75 MG TBDP; Take 1 tablet (75 mg total) by mouth daily as needed.  Hypokalemia On supplement. Labs pending.  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 Has upcoming  removal, likely benign.    Return in about 3 months (around 04/26/2023) for chronic follow up.   The patient indicates understanding of these issues and agrees with the plan.  Gabriel Earing, FNP

## 2023-01-25 LAB — CBC WITH DIFFERENTIAL/PLATELET
Basophils Absolute: 0.1 10*3/uL (ref 0.0–0.2)
Basos: 1 %
EOS (ABSOLUTE): 0.3 10*3/uL (ref 0.0–0.4)
Eos: 2 %
Hematocrit: 37.8 % (ref 34.0–46.6)
Hemoglobin: 12.2 g/dL (ref 11.1–15.9)
Immature Grans (Abs): 0 10*3/uL (ref 0.0–0.1)
Immature Granulocytes: 0 %
Lymphocytes Absolute: 3.3 10*3/uL — ABNORMAL HIGH (ref 0.7–3.1)
Lymphs: 30 %
MCH: 26.1 pg — ABNORMAL LOW (ref 26.6–33.0)
MCHC: 32.3 g/dL (ref 31.5–35.7)
MCV: 81 fL (ref 79–97)
Monocytes Absolute: 0.7 10*3/uL (ref 0.1–0.9)
Monocytes: 7 %
Neutrophils Absolute: 6.5 10*3/uL (ref 1.4–7.0)
Neutrophils: 60 %
Platelets: 372 10*3/uL (ref 150–450)
RBC: 4.67 x10E6/uL (ref 3.77–5.28)
RDW: 13.7 % (ref 11.7–15.4)
WBC: 10.9 10*3/uL — ABNORMAL HIGH (ref 3.4–10.8)

## 2023-01-25 LAB — LIPID PANEL
Chol/HDL Ratio: 3.5 ratio (ref 0.0–4.4)
Cholesterol, Total: 139 mg/dL (ref 100–199)
HDL: 40 mg/dL (ref >39)
LDL Chol Calc (NIH): 57 mg/dL (ref 0–99)
Triglycerides: 263 mg/dL — ABNORMAL HIGH (ref 0–149)
VLDL Cholesterol Cal: 42 mg/dL — ABNORMAL HIGH (ref 5–40)

## 2023-01-25 LAB — MICROALBUMIN / CREATININE URINE RATIO
Creatinine, Urine: 44 mg/dL
Microalb/Creat Ratio: <7 mg/g creat (ref 0–29)
Microalbumin, Urine: <3 ug/mL

## 2023-01-25 LAB — CMP14+EGFR
ALT: 13 IU/L (ref 0–32)
AST: 14 IU/L (ref 0–40)
Albumin: 4.2 g/dL (ref 3.8–4.9)
Alkaline Phosphatase: 126 IU/L — ABNORMAL HIGH (ref 44–121)
BUN/Creatinine Ratio: 6 — ABNORMAL LOW (ref 9–23)
BUN: 4 mg/dL — ABNORMAL LOW (ref 6–24)
Bilirubin Total: 0.2 mg/dL (ref 0.0–1.2)
CO2: 24 mmol/L (ref 20–29)
Calcium: 9.4 mg/dL (ref 8.7–10.2)
Chloride: 101 mmol/L (ref 96–106)
Creatinine, Ser: 0.69 mg/dL (ref 0.57–1.00)
Globulin, Total: 2.7 g/dL (ref 1.5–4.5)
Glucose: 96 mg/dL (ref 70–99)
Potassium: 3.7 mmol/L (ref 3.5–5.2)
Sodium: 139 mmol/L (ref 134–144)
Total Protein: 6.9 g/dL (ref 6.0–8.5)
eGFR: 105 mL/min/{1.73_m2} (ref >59)

## 2023-01-26 ENCOUNTER — Other Ambulatory Visit: Payer: Self-pay | Admitting: Family Medicine

## 2023-01-28 LAB — SPECIMEN STATUS REPORT

## 2023-01-28 LAB — ALKALINE PHOSPHATASE, ISOENZYMES

## 2023-01-29 LAB — ALKALINE PHOSPHATASE, ISOENZYMES

## 2023-01-31 ENCOUNTER — Encounter: Payer: Self-pay | Admitting: Family Medicine

## 2023-02-03 LAB — ALKALINE PHOSPHATASE, ISOENZYMES: Alkaline Phosphatase: 127 IU/L — ABNORMAL HIGH (ref 44–121)

## 2023-02-04 ENCOUNTER — Telehealth: Payer: Self-pay

## 2023-02-04 ENCOUNTER — Other Ambulatory Visit (HOSPITAL_COMMUNITY): Payer: Self-pay

## 2023-02-04 NOTE — Telephone Encounter (Signed)
Called [employer] [phone]) to initiate PA for Nurtec. Waiting on PA forms to be faxed in.   Case #: [id number]

## 2023-02-04 NOTE — Telephone Encounter (Addendum)
Called [employer] [phone]) to initiate PA for Ozempic. Waiting on PA forms to be faxed in.  Case #: [id number]

## 2023-02-07 ENCOUNTER — Other Ambulatory Visit (HOSPITAL_COMMUNITY): Payer: Self-pay

## 2023-02-07 NOTE — Telephone Encounter (Signed)
Pharmacy Patient Advocate Encounter  Received notification from  [employer]  that Prior Authorization for Ozempic has been APPROVED from 02/07/23 to 07/25/98.Marland Kitchen    PA #/Case ID/Reference #: [id number]

## 2023-02-08 NOTE — Telephone Encounter (Signed)
Received PA form. Completed and faxed back to [phone]. PA marked urgent.

## 2023-02-11 ENCOUNTER — Other Ambulatory Visit (HOSPITAL_COMMUNITY): Payer: Self-pay

## 2023-02-11 NOTE — Telephone Encounter (Signed)
Pharmacy Patient Advocate Encounter  Received notification from  [employer]  that Prior Authorization for Nurtec ODT 75MG  has been APPROVED from 02/11/23 to 02/11/24.Marland Kitchen  PA #/Case ID/Reference #: [id number]

## 2023-02-18 ENCOUNTER — Telehealth: Payer: Self-pay | Admitting: Family Medicine

## 2023-02-22 ENCOUNTER — Encounter: Payer: Self-pay | Admitting: Family Medicine

## 2023-02-23 ENCOUNTER — Other Ambulatory Visit: Payer: Self-pay | Admitting: Family Medicine

## 2023-02-23 DIAGNOSIS — E1165 Type 2 diabetes mellitus with hyperglycemia: Secondary | ICD-10-CM

## 2023-02-23 MED ORDER — METFORMIN HCL 500 MG PO TABS
500.0000 mg | ORAL_TABLET | Freq: Two times a day (BID) | ORAL | 3 refills | Status: DC
Start: 2023-02-23 — End: 2023-03-31

## 2023-02-23 NOTE — Pre-Procedure Instructions (Signed)
Surgical Instructions   Your procedure is scheduled on Wednesday, August 7th. Report to [facility] Main Entrance "A" at 06:30 A.M., then check in with the Admitting office. Any questions or running late day of surgery: call [phone]  Questions prior to your surgery date: call [phone], Monday-Friday, 8am-4pm. If you experience any cold or flu symptoms such as cough, fever, chills, shortness of breath, etc. between now and your scheduled surgery, please notify us at the above number.     Remember:  Do not eat after midnight the night before your surgery   You may drink clear liquids until 05:30 AM the morning of your surgery.   Clear liquids allowed are: Water, Non-Citrus Juices (without pulp), Carbonated Beverages, Clear Tea, Black Coffee Only (NO MILK, CREAM OR POWDERED CREAMER of any kind), and Gatorade.    Take these medicines the morning of surgery with A SIP OF WATER  rosuvastatin (CRESTOR)    May take these medicines IF NEEDED: Rimegepant Sulfate (NURTEC)    One week prior to surgery, STOP taking any Aspirin (unless otherwise instructed by your surgeon) Aleve, Naproxen, Ibuprofen, Motrin, Advil, Goody's, BC's, all herbal medications, fish oil, and non-prescription vitamins.   WHAT DO I DO ABOUT MY DIABETES MEDICATION?   Do not take metFORMIN (GLUCOPHAGE) the morning of surgery.  STOP TAKING SEMAGLUTIDE 7 DAYS PRIOR TO SURGERY. Last dose on or before 7/30.       HOW TO MANAGE YOUR DIABETES BEFORE AND AFTER SURGERY  Why is it important to control my blood sugar before and after surgery? Improving blood sugar levels before and after surgery helps healing and can limit problems. A way of improving blood sugar control is eating a healthy diet by:  Eating less sugar and carbohydrates  Increasing activity/exercise  Talking with your doctor about reaching your blood sugar goals High blood sugars (greater than 180 mg/dL) can raise your risk of infections and slow  your recovery, so you will need to focus on controlling your diabetes during the weeks before surgery. Make sure that the doctor who takes care of your diabetes knows about your planned surgery including the date and location.  How do I manage my blood sugar before surgery? Check your blood sugar at least 4 times a day, starting 2 days before surgery, to make sure that the level is not too high or low.  Check your blood sugar the morning of your surgery when you wake up and every 2 hours until you get to the Short Stay unit.  If your blood sugar is less than 70 mg/dL, you will need to treat for low blood sugar: Do not take insulin. Treat a low blood sugar (less than 70 mg/dL) with  cup of clear juice (cranberry or apple), 4 glucose tablets, OR glucose gel. Recheck blood sugar in 15 minutes after treatment (to make sure it is greater than 70 mg/dL). If your blood sugar is not greater than 70 mg/dL on recheck, call [phone] for further instructions. Report your blood sugar to the short stay nurse when you get to Short Stay.  If you are admitted to the hospital after surgery: Your blood sugar will be checked by the staff and you will probably be given insulin after surgery (instead of oral diabetes medicines) to make sure you have good blood sugar levels. The goal for blood sugar control after surgery is 80-180 mg/dL.  Do NOT Smoke (Tobacco/Vaping) for 24 hours prior to your procedure.  If you use a CPAP at night, you may bring your mask/headgear for your overnight stay.   You will be asked to remove any contacts, glasses, piercing's, hearing aid's, dentures/partials prior to surgery. Please bring cases for these items if needed.    Patients discharged the day of surgery will not be allowed to drive home, and someone needs to stay with them for 24 hours.  SURGICAL WAITING ROOM VISITATION Patients may have no more than 2 support people in the waiting area - these  visitors may rotate.   Pre-op nurse will coordinate an appropriate time for 1 ADULT support person, who may not rotate, to accompany patient in pre-op.  Children under the age of 53 must have an adult with them who is not the patient and must remain in the main waiting area with an adult.  If the patient needs to stay at the hospital during part of their recovery, the visitor guidelines for inpatient rooms apply.  Please refer to the [facility] website for the visitor guidelines for any additional information.   If you received a COVID test during your pre-op visit  it is requested that you wear a mask when out in public, stay away from anyone that may not be feeling well and notify your surgeon if you develop symptoms. If you have been in contact with anyone that has tested positive in the last 10 days please notify you surgeon.      Pre-operative CHG Bathing Instructions   You can play a key role in reducing the risk of infection after surgery. Your skin needs to be as free of germs as possible. You can reduce the number of germs on your skin by washing with CHG (chlorhexidine gluconate) soap before surgery. CHG is an antiseptic soap that kills germs and continues to kill germs even after washing.   DO NOT use if you have an allergy to chlorhexidine/CHG or antibacterial soaps. If your skin becomes reddened or irritated, stop using the CHG and notify one of our RNs at [phone].              TAKE A SHOWER THE NIGHT BEFORE SURGERY AND THE DAY OF SURGERY    Please keep in mind the following:  DO NOT shave, including legs and underarms, 48 hours prior to surgery.   You may shave your face before/day of surgery.  Place clean sheets on your bed the night before surgery Use a clean washcloth (not used since being washed) for each shower. DO NOT sleep with pet's night before surgery.  CHG Shower Instructions:  If you choose to wash your hair and private area, wash first with your normal  shampoo/soap.  After you use shampoo/soap, rinse your hair and body thoroughly to remove shampoo/soap residue.  Turn the water OFF and apply half the bottle of CHG soap to a CLEAN washcloth.  Apply CHG soap ONLY FROM YOUR NECK DOWN TO YOUR TOES (washing for 3-5 minutes)  DO NOT use CHG soap on face, private areas, open wounds, or sores.  Pay special attention to the area where your surgery is being performed.  If you are having back surgery, having someone wash your back for you may be helpful. Wait 2 minutes after CHG soap is applied, then you may rinse off the CHG soap.  Pat dry with a clean towel  Put on clean pajamas    Additional instructions for the  day of surgery: DO NOT APPLY any lotions, deodorants, cologne, or perfumes.   Do not wear jewelry or makeup Do not wear nail polish, gel polish, artificial nails, or any other type of covering on natural nails (fingers and toes) Do not bring valuables to the hospital. [facility] is not responsible for valuables/personal belongings. Put on clean/comfortable clothes.  Please brush your teeth.  Ask your nurse before applying any prescription medications to the skin.

## 2023-02-23 NOTE — Telephone Encounter (Signed)
Pt aware of Julies feedback regarding clearance from ENT prior to starting GLP.

## 2023-02-24 ENCOUNTER — Encounter (HOSPITAL_COMMUNITY)
Admission: RE | Admit: 2023-02-24 | Discharge: 2023-02-24 | Disposition: A | Discharge status: Home / Self Care | Payer: Managed Care, Other (non HMO) | Source: Ambulatory Visit | Attending: Otolaryngology | Admitting: Otolaryngology

## 2023-02-24 ENCOUNTER — Other Ambulatory Visit: Payer: Self-pay

## 2023-02-24 ENCOUNTER — Other Ambulatory Visit: Payer: Self-pay | Admitting: Family Medicine

## 2023-02-24 ENCOUNTER — Encounter (HOSPITAL_COMMUNITY): Payer: Self-pay

## 2023-02-24 VITALS — BP 133/89 | HR 112 | Temp 98.4°F | Resp 17 | Ht 67.0 in | Wt 170.0 lb

## 2023-02-24 DIAGNOSIS — Z01818 Encounter for other preprocedural examination: Secondary | ICD-10-CM

## 2023-02-24 DIAGNOSIS — E1165 Type 2 diabetes mellitus with hyperglycemia: Secondary | ICD-10-CM

## 2023-02-24 DIAGNOSIS — Z01812 Encounter for preprocedural laboratory examination: Secondary | ICD-10-CM | POA: Insufficient documentation

## 2023-02-24 HISTORY — DX: Type 2 diabetes mellitus without complications: E11.9

## 2023-02-24 HISTORY — DX: Pure hypercholesterolemia, unspecified: E78.00

## 2023-02-24 HISTORY — DX: Anxiety disorder, unspecified: F41.9

## 2023-02-24 LAB — GLUCOSE, CAPILLARY: Glucose-Capillary: 149 mg/dL — ABNORMAL HIGH (ref 70–99)

## 2023-02-24 LAB — BASIC METABOLIC PANEL
Anion gap: 10 (ref 5–15)
BUN: <5 mg/dL — ABNORMAL LOW (ref 6–20)
CO2: 27 mmol/L (ref 22–32)
Calcium: 9.4 mg/dL (ref 8.9–10.3)
Chloride: 99 mmol/L (ref 98–111)
Creatinine, Ser: 0.72 mg/dL (ref 0.44–1.00)
GFR, Estimated: >60 mL/min (ref >60)
Glucose, Bld: 119 mg/dL — ABNORMAL HIGH (ref 70–99)
Potassium: 3.3 mmol/L — ABNORMAL LOW (ref 3.5–5.1)
Sodium: 136 mmol/L (ref 135–145)

## 2023-02-24 LAB — CBC
HCT: 40.5 % (ref 36.0–46.0)
Hemoglobin: 12.7 g/dL (ref 12.0–15.0)
MCH: 25.7 pg — ABNORMAL LOW (ref 26.0–34.0)
MCHC: 31.4 g/dL (ref 30.0–36.0)
MCV: 82 fL (ref 80.0–100.0)
Platelets: 397 10*3/uL (ref 150–400)
RBC: 4.94 MIL/uL (ref 3.87–5.11)
RDW: 13.4 % (ref 11.5–15.5)
WBC: 12.3 10*3/uL — ABNORMAL HIGH (ref 4.0–10.5)
nRBC: 0 % (ref 0.0–0.2)

## 2023-02-24 MED ORDER — EMPAGLIFLOZIN 10 MG PO TABS
10.0000 mg | ORAL_TABLET | Freq: Every day | ORAL | 3 refills | Status: DC
Start: 2023-02-24 — End: 2023-03-31

## 2023-02-24 NOTE — Pre-Procedure Instructions (Signed)
Surgical Instructions   Your procedure is scheduled on Wednesday, August 7th. Report to [facility] Main Entrance "A" at 06:30 A.M., then check in with the Admitting office. Any questions or running late day of surgery: call [phone]  Questions prior to your surgery date: call [phone], Monday-Friday, 8am-4pm. If you experience any cold or flu symptoms such as cough, fever, chills, shortness of breath, etc. between now and your scheduled surgery, please notify us at the above number.     Remember:  Do not eat after midnight the night before your surgery   You may drink clear liquids until 05:30 AM the morning of your surgery.   Clear liquids allowed are: Water, Non-Citrus Juices (without pulp), Carbonated Beverages, Clear Tea, Black Coffee Only (NO MILK, CREAM OR POWDERED CREAMER of any kind), and Gatorade.    Take these medicines the morning of surgery with A SIP OF WATER  rosuvastatin (CRESTOR)    May take these medicines IF NEEDED: Rimegepant Sulfate (NURTEC)    One week prior to surgery, STOP taking any Aspirin (unless otherwise instructed by your surgeon) Aleve, Naproxen, Ibuprofen, Motrin, Advil, Goody's, BC's, all herbal medications, fish oil, and non-prescription vitamins.   WHAT DO I DO ABOUT MY DIABETES MEDICATION?   Do not take metFORMIN (GLUCOPHAGE) the morning of surgery.        Stop taking your empagliflozin (JARDIANCE) 72 hours prior to surgery.  Last dose should be 7/3/20204.   STOP TAKING SEMAGLUTIDE 7 DAYS PRIOR TO SURGERY. Last dose on or before 7/30.       HOW TO MANAGE YOUR DIABETES BEFORE AND AFTER SURGERY  Why is it important to control my blood sugar before and after surgery? Improving blood sugar levels before and after surgery helps healing and can limit problems. A way of improving blood sugar control is eating a healthy diet by:  Eating less sugar and carbohydrates  Increasing activity/exercise  Talking with your doctor about reaching  your blood sugar goals High blood sugars (greater than 180 mg/dL) can raise your risk of infections and slow your recovery, so you will need to focus on controlling your diabetes during the weeks before surgery. Make sure that the doctor who takes care of your diabetes knows about your planned surgery including the date and location.  How do I manage my blood sugar before surgery? Check your blood sugar at least 4 times a day, starting 2 days before surgery, to make sure that the level is not too high or low.  Check your blood sugar the morning of your surgery when you wake up and every 2 hours until you get to the Short Stay unit.  If your blood sugar is less than 70 mg/dL, you will need to treat for low blood sugar: Do not take insulin. Treat a low blood sugar (less than 70 mg/dL) with  cup of clear juice (cranberry or apple), 4 glucose tablets, OR glucose gel. Recheck blood sugar in 15 minutes after treatment (to make sure it is greater than 70 mg/dL). If your blood sugar is not greater than 70 mg/dL on recheck, call [phone] for further instructions. Report your blood sugar to the short stay nurse when you get to Short Stay.  If you are admitted to the hospital after surgery: Your blood sugar will be checked by the staff and you will probably be given insulin after surgery (instead of oral diabetes medicines) to make sure you have good blood sugar levels. The goal for blood sugar control  after surgery is 80-180 mg/dL.                    Do NOT Smoke (Tobacco/Vaping) for 24 hours prior to your procedure.  If you use a CPAP at night, you may bring your mask/headgear for your overnight stay.   You will be asked to remove any contacts, glasses, piercing's, hearing aid's, dentures/partials prior to surgery. Please bring cases for these items if needed.    Patients discharged the day of surgery will not be allowed to drive home, and someone needs to stay with them for 24  hours.  SURGICAL WAITING ROOM VISITATION Patients may have no more than 2 support people in the waiting area - these visitors may rotate.   Pre-op nurse will coordinate an appropriate time for 1 ADULT support person, who may not rotate, to accompany patient in pre-op.  Children under the age of 48 must have an adult with them who is not the patient and must remain in the main waiting area with an adult.  If the patient needs to stay at the hospital during part of their recovery, the visitor guidelines for inpatient rooms apply.  Please refer to the [facility] website for the visitor guidelines for any additional information.   If you received a COVID test during your pre-op visit  it is requested that you wear a mask when out in public, stay away from anyone that may not be feeling well and notify your surgeon if you develop symptoms. If you have been in contact with anyone that has tested positive in the last 10 days please notify you surgeon.      Pre-operative CHG Bathing Instructions   You can play a key role in reducing the risk of infection after surgery. Your skin needs to be as free of germs as possible. You can reduce the number of germs on your skin by washing with CHG (chlorhexidine gluconate) soap before surgery. CHG is an antiseptic soap that kills germs and continues to kill germs even after washing.   DO NOT use if you have an allergy to chlorhexidine/CHG or antibacterial soaps. If your skin becomes reddened or irritated, stop using the CHG and notify one of our RNs at [phone].              TAKE A SHOWER THE NIGHT BEFORE SURGERY AND THE DAY OF SURGERY    Please keep in mind the following:  DO NOT shave, including legs and underarms, 48 hours prior to surgery.   You may shave your face before/day of surgery.  Place clean sheets on your bed the night before surgery Use a clean washcloth (not used since being washed) for each shower. DO NOT sleep with pet's night  before surgery.  CHG Shower Instructions:  If you choose to wash your hair and private area, wash first with your normal shampoo/soap.  After you use shampoo/soap, rinse your hair and body thoroughly to remove shampoo/soap residue.  Turn the water OFF and apply half the bottle of CHG soap to a CLEAN washcloth.  Apply CHG soap ONLY FROM YOUR NECK DOWN TO YOUR TOES (washing for 3-5 minutes)  DO NOT use CHG soap on face, private areas, open wounds, or sores.  Pay special attention to the area where your surgery is being performed.  If you are having back surgery, having someone wash your back for you may be helpful. Wait 2 minutes after CHG soap is applied, then you may  rinse off the CHG soap.  Pat dry with a clean towel  Put on clean pajamas    Additional instructions for the day of surgery: DO NOT APPLY any lotions, deodorants, cologne, or perfumes.   Do not wear jewelry or makeup Do not wear nail polish, gel polish, artificial nails, or any other type of covering on natural nails (fingers and toes) Do not bring valuables to the hospital. [facility] is not responsible for valuables/personal belongings. Put on clean/comfortable clothes.  Please brush your teeth.  Ask your nurse before applying any prescription medications to the skin.

## 2023-02-24 NOTE — Progress Notes (Signed)
PCP - Harlow Mares, FNP Cardiologist -   PPM/ICD - denies Device Orders - n/a Rep Notified - n/a  Chest x-ray - na EKG - n/a Stress Test -  ECHO -  Cardiac Cath -   Sleep Study - denies CPAP - denies  Type II diabetic with A1C 7.4 on 01/24/2023 Fasting Blood Sugar - Does not check blood Checks Blood Sugar Does not check blood sugar  Last dose of GLP1 agonist-  Ozempic, states last dose 02/11/2023 GLP1 instructions: last  dose 02/11/2023  Blood Thinner Instructions: denies Aspirin Instructions:denies  ERAS Protcol - Yes, clear fluids until 0530  COVID TEST- n/a   Anesthesia review: No  Patient denies shortness of breath, fever, cough and chest pain at PAT appointment   All instructions explained to the patient, with a verbal understanding of the material. Patient agrees to go over the instructions while at home for a better understanding. Patient also instructed to self quarantine after being tested for COVID-19. The opportunity to ask questions was provided.

## 2023-03-01 NOTE — Anesthesia Preprocedure Evaluation (Signed)
Anesthesia Evaluation  Patient identified by MRN, date of birth, ID band Patient awake  General Assessment Comment: Benign neoplasm of parotid gland  Reviewed: Allergy & Precautions, NPO status , Patient's Chart, lab work & pertinent test results  History of Anesthesia Complications Negative for: history of anesthetic complications  Airway Mallampati: II  TM Distance: >3 FB Neck ROM: Full    Dental  (+) Dental Advisory Given, Edentulous Upper, Missing,    Pulmonary Current SmokerPatient did not abstain from smoking.   Pulmonary exam normal breath sounds clear to auscultation       Cardiovascular hypertension, Pt. on medications Normal cardiovascular exam Rhythm:Regular Rate:Normal     Neuro/Psych  Headaches PSYCHIATRIC DISORDERS Anxiety        GI/Hepatic negative GI ROS, Neg liver ROS,,,  Endo/Other  diabetes, Type 2, Oral Hypoglycemic Agents    Renal/GU negative Renal ROS     Musculoskeletal negative musculoskeletal ROS (+)    Abdominal   Peds  Hematology negative hematology ROS (+)   Anesthesia Other Findings   Reproductive/Obstetrics                             Anesthesia Physical Anesthesia Plan  ASA: 2  Anesthesia Plan: General   Post-op Pain Management: Tylenol PO (pre-op)* and Toradol IV (intra-op)*   Induction: Intravenous  PONV Risk Score and Plan: 3 and Midazolam, Dexamethasone and Ondansetron  Airway Management Planned: Oral ETT  Additional Equipment:   Intra-op Plan:   Post-operative Plan: Extubation in OR  Informed Consent: I have reviewed the patients History and Physical, chart, labs and discussed the procedure including the risks, benefits and alternatives for the proposed anesthesia with the patient or authorized representative who has indicated his/her understanding and acceptance.     Dental advisory given  Plan Discussed with: CRNA  Anesthesia Plan  Comments:        Anesthesia Quick Evaluation

## 2023-03-02 ENCOUNTER — Encounter (HOSPITAL_COMMUNITY)
Admission: RE | Disposition: A | Discharge status: Home / Self Care | Payer: Self-pay | Source: Home / Self Care | Attending: Otolaryngology

## 2023-03-02 ENCOUNTER — Other Ambulatory Visit: Payer: Self-pay

## 2023-03-02 ENCOUNTER — Ambulatory Visit (HOSPITAL_COMMUNITY): Payer: Managed Care, Other (non HMO) | Admitting: Anesthesiology

## 2023-03-02 ENCOUNTER — Encounter (HOSPITAL_COMMUNITY): Payer: Self-pay | Admitting: Otolaryngology

## 2023-03-02 ENCOUNTER — Observation Stay (HOSPITAL_COMMUNITY)
Admission: RE | Admit: 2023-03-02 | Discharge: 2023-03-03 | Disposition: A | Discharge status: Home / Self Care | Payer: Managed Care, Other (non HMO) | Attending: Otolaryngology | Admitting: Otolaryngology

## 2023-03-02 ENCOUNTER — Ambulatory Visit (HOSPITAL_BASED_OUTPATIENT_CLINIC_OR_DEPARTMENT_OTHER): Payer: Managed Care, Other (non HMO) | Admitting: Anesthesiology

## 2023-03-02 DIAGNOSIS — D110 Benign neoplasm of parotid gland: Secondary | ICD-10-CM

## 2023-03-02 DIAGNOSIS — F17210 Nicotine dependence, cigarettes, uncomplicated: Secondary | ICD-10-CM | POA: Diagnosis not present

## 2023-03-02 DIAGNOSIS — Z7984 Long term (current) use of oral hypoglycemic drugs: Secondary | ICD-10-CM | POA: Insufficient documentation

## 2023-03-02 DIAGNOSIS — C07 Malignant neoplasm of parotid gland: Secondary | ICD-10-CM | POA: Diagnosis present

## 2023-03-02 DIAGNOSIS — Z79899 Other long term (current) drug therapy: Secondary | ICD-10-CM | POA: Insufficient documentation

## 2023-03-02 DIAGNOSIS — E119 Type 2 diabetes mellitus without complications: Secondary | ICD-10-CM | POA: Diagnosis not present

## 2023-03-02 DIAGNOSIS — D490 Neoplasm of unspecified behavior of digestive system: Principal | ICD-10-CM | POA: Diagnosis present

## 2023-03-02 HISTORY — PX: PAROTIDECTOMY: SHX2163

## 2023-03-02 LAB — GLUCOSE, CAPILLARY
Glucose-Capillary: 135 mg/dL — ABNORMAL HIGH (ref 70–99)
Glucose-Capillary: 115 mg/dL — ABNORMAL HIGH (ref 70–99)
Glucose-Capillary: 139 mg/dL — ABNORMAL HIGH (ref 70–99)
Glucose-Capillary: 256 mg/dL — ABNORMAL HIGH (ref 70–99)
Glucose-Capillary: 207 mg/dL — ABNORMAL HIGH (ref 70–99)

## 2023-03-02 SURGERY — EXCISION, PAROTID GLAND
Anesthesia: General | Site: Neck | Laterality: Right

## 2023-03-02 MED ORDER — ONDANSETRON HCL 4 MG/2ML IJ SOLN
INTRAMUSCULAR | Status: DC | PRN
Start: 2023-03-02 — End: 2023-03-02
  Administered 2023-03-02: 4 mg via INTRAVENOUS

## 2023-03-02 MED ORDER — FENTANYL CITRATE (PF) 100 MCG/2ML IJ SOLN
INTRAMUSCULAR | Status: AC
  Filled 2023-03-02: qty 2

## 2023-03-02 MED ORDER — FENTANYL CITRATE (PF) 250 MCG/5ML IJ SOLN
INTRAMUSCULAR | Status: AC
  Filled 2023-03-02: qty 5

## 2023-03-02 MED ORDER — ACETAMINOPHEN 500 MG PO TABS
1000.0000 mg | ORAL_TABLET | Freq: Once | ORAL | Status: AC
  Administered 2023-03-02: 1000 mg via ORAL
  Filled 2023-03-02: qty 2

## 2023-03-02 MED ORDER — RIMEGEPANT SULFATE 75 MG PO TBDP: 75.0000 mg | ORAL_TABLET | Freq: Every day | ORAL | Status: DC | PRN

## 2023-03-02 MED ORDER — CHLORHEXIDINE GLUCONATE 0.12 % MT SOLN
15.0000 mL | Freq: Once | OROMUCOSAL | Status: AC
  Administered 2023-03-02: 15 mL via OROMUCOSAL
  Filled 2023-03-02: qty 15

## 2023-03-02 MED ORDER — ONDANSETRON HCL 4 MG/2ML IJ SOLN
INTRAMUSCULAR | Status: AC
  Filled 2023-03-02: qty 2

## 2023-03-02 MED ORDER — LIDOCAINE-EPINEPHRINE 1 %-1:100000 IJ SOLN
INTRAMUSCULAR | Status: AC
  Filled 2023-03-02: qty 1

## 2023-03-02 MED ORDER — GLYCOPYRROLATE PF 0.2 MG/ML IJ SOSY
PREFILLED_SYRINGE | INTRAMUSCULAR | Status: DC | PRN
  Administered 2023-03-02 (×2): .1 mg via INTRAVENOUS

## 2023-03-02 MED ORDER — POTASSIUM CHLORIDE CRYS ER 10 MEQ PO TBCR
30.0000 meq | EXTENDED_RELEASE_TABLET | Freq: Every day | ORAL | Status: DC
  Administered 2023-03-03: 30 meq via ORAL
  Filled 2023-03-02 (×3): qty 3

## 2023-03-02 MED ORDER — LISINOPRIL 2.5 MG PO TABS
2.5000 mg | ORAL_TABLET | Freq: Every day | ORAL | Status: DC
  Administered 2023-03-03: 2.5 mg via ORAL
  Filled 2023-03-02 (×2): qty 1

## 2023-03-02 MED ORDER — LIDOCAINE-EPINEPHRINE 1 %-1:100000 IJ SOLN
INTRAMUSCULAR | Status: DC | PRN
  Administered 2023-03-02: 2 mL

## 2023-03-02 MED ORDER — GLYCOPYRROLATE PF 0.2 MG/ML IJ SOSY
PREFILLED_SYRINGE | INTRAMUSCULAR | Status: AC
  Filled 2023-03-02: qty 1

## 2023-03-02 MED ORDER — PROPOFOL 10 MG/ML IV BOLUS
INTRAVENOUS | Status: AC
  Filled 2023-03-02: qty 20

## 2023-03-02 MED ORDER — HYDROCODONE-ACETAMINOPHEN 5-325 MG PO TABS: 1.0000 | ORAL_TABLET | ORAL | Status: DC | PRN

## 2023-03-02 MED ORDER — DEXAMETHASONE SODIUM PHOSPHATE 10 MG/ML IJ SOLN
INTRAMUSCULAR | Status: AC
  Filled 2023-03-02: qty 1

## 2023-03-02 MED ORDER — BACITRACIN ZINC 500 UNIT/GM EX OINT
TOPICAL_OINTMENT | CUTANEOUS | Status: AC
  Filled 2023-03-02: qty 28.35

## 2023-03-02 MED ORDER — LIDOCAINE 2% (20 MG/ML) 5 ML SYRINGE
INTRAMUSCULAR | Status: DC | PRN
  Administered 2023-03-02: 80 mg via INTRAVENOUS

## 2023-03-02 MED ORDER — VANCOMYCIN HCL 1500 MG/300ML IV SOLN
1500.0000 mg | INTRAVENOUS | Status: AC
  Administered 2023-03-02: 1500 mg via INTRAVENOUS
  Filled 2023-03-02: qty 300

## 2023-03-02 MED ORDER — ROSUVASTATIN CALCIUM 5 MG PO TABS
10.0000 mg | ORAL_TABLET | Freq: Every day | ORAL | Status: DC
  Administered 2023-03-03: 10 mg via ORAL
  Filled 2023-03-02: qty 2

## 2023-03-02 MED ORDER — KETOROLAC TROMETHAMINE 30 MG/ML IJ SOLN
INTRAMUSCULAR | Status: AC
  Filled 2023-03-02: qty 1

## 2023-03-02 MED ORDER — SUCCINYLCHOLINE CHLORIDE 200 MG/10ML IV SOSY
PREFILLED_SYRINGE | INTRAVENOUS | Status: DC | PRN
  Administered 2023-03-02: 100 mg via INTRAVENOUS

## 2023-03-02 MED ORDER — LACTATED RINGERS IV SOLN: INTRAVENOUS | Status: DC

## 2023-03-02 MED ORDER — PROPOFOL 10 MG/ML IV BOLUS
INTRAVENOUS | Status: DC | PRN
Start: 2023-03-02 — End: 2023-03-02
  Administered 2023-03-02: 50 mg via INTRAVENOUS
  Administered 2023-03-02: 150 mg via INTRAVENOUS
  Administered 2023-03-02: 100 ug/kg/min via INTRAVENOUS
  Administered 2023-03-02 (×2): 20 mg via INTRAVENOUS

## 2023-03-02 MED ORDER — SUCCINYLCHOLINE CHLORIDE 200 MG/10ML IV SOSY
PREFILLED_SYRINGE | INTRAVENOUS | Status: AC
  Filled 2023-03-02: qty 10

## 2023-03-02 MED ORDER — INSULIN ASPART 100 UNIT/ML IJ SOLN: 0.0000 [IU] | INTRAMUSCULAR | Status: DC | PRN

## 2023-03-02 MED ORDER — POTASSIUM CHLORIDE IN NACL 20-0.45 MEQ/L-% IV SOLN
INTRAVENOUS | Status: DC
  Filled 2023-03-02: qty 1000

## 2023-03-02 MED ORDER — MIDAZOLAM HCL 2 MG/2ML IJ SOLN
INTRAMUSCULAR | Status: AC
  Filled 2023-03-02: qty 2

## 2023-03-02 MED ORDER — LACTATED RINGERS IV SOLN: INTRAVENOUS | Status: DC | PRN

## 2023-03-02 MED ORDER — 0.9 % SODIUM CHLORIDE (POUR BTL) OPTIME
TOPICAL | Status: DC | PRN
  Administered 2023-03-02: 1000 mL

## 2023-03-02 MED ORDER — FENTANYL CITRATE (PF) 250 MCG/5ML IJ SOLN
INTRAMUSCULAR | Status: DC | PRN
  Administered 2023-03-02 (×3): 50 ug via INTRAVENOUS

## 2023-03-02 MED ORDER — MORPHINE SULFATE (PF) 2 MG/ML IV SOLN: 2.0000 mg | INTRAVENOUS | Status: DC | PRN

## 2023-03-02 MED ORDER — ONDANSETRON HCL 4 MG/2ML IJ SOLN: 4.0000 mg | Freq: Once | INTRAMUSCULAR | Status: DC | PRN

## 2023-03-02 MED ORDER — PHENYLEPHRINE 80 MCG/ML (10ML) SYRINGE FOR IV PUSH (FOR BLOOD PRESSURE SUPPORT)
PREFILLED_SYRINGE | INTRAVENOUS | Status: DC | PRN
  Administered 2023-03-02: 160 ug via INTRAVENOUS

## 2023-03-02 MED ORDER — ORAL CARE MOUTH RINSE: 15.0000 mL | Freq: Once | OROMUCOSAL | Status: AC

## 2023-03-02 MED ORDER — NICOTINE 21 MG/24HR TD PT24
21.0000 mg | MEDICATED_PATCH | Freq: Every day | TRANSDERMAL | Status: DC
  Administered 2023-03-02 – 2023-03-03 (×2): 21 mg via TRANSDERMAL
  Filled 2023-03-02 (×2): qty 1

## 2023-03-02 MED ORDER — EPHEDRINE SULFATE-NACL 50-0.9 MG/10ML-% IV SOSY: PREFILLED_SYRINGE | INTRAVENOUS | Status: DC | PRN

## 2023-03-02 MED ORDER — DEXAMETHASONE SODIUM PHOSPHATE 10 MG/ML IJ SOLN
INTRAMUSCULAR | Status: DC | PRN
  Administered 2023-03-02: 10 mg via INTRAVENOUS

## 2023-03-02 MED ORDER — AMISULPRIDE (ANTIEMETIC) 5 MG/2ML IV SOLN: 10.0000 mg | Freq: Once | INTRAVENOUS | Status: DC | PRN

## 2023-03-02 MED ORDER — KETOROLAC TROMETHAMINE 30 MG/ML IJ SOLN
INTRAMUSCULAR | Status: DC | PRN
  Administered 2023-03-02: 30 mg via INTRAVENOUS

## 2023-03-02 MED ORDER — MIDAZOLAM HCL 2 MG/2ML IJ SOLN
INTRAMUSCULAR | Status: DC | PRN
  Administered 2023-03-02: 2 mg via INTRAVENOUS

## 2023-03-02 MED ORDER — METFORMIN HCL 500 MG PO TABS
500.0000 mg | ORAL_TABLET | Freq: Two times a day (BID) | ORAL | Status: DC
  Filled 2023-03-02: qty 1

## 2023-03-02 MED ORDER — PHENYLEPHRINE HCL-NACL 20-0.9 MG/250ML-% IV SOLN
INTRAVENOUS | Status: DC | PRN
  Administered 2023-03-02: 40 ug/min via INTRAVENOUS

## 2023-03-02 MED ORDER — BACITRACIN ZINC 500 UNIT/GM EX OINT
1.0000 | TOPICAL_OINTMENT | Freq: Three times a day (TID) | CUTANEOUS | Status: DC
  Administered 2023-03-02 – 2023-03-03 (×2): 1 via TOPICAL
  Filled 2023-03-02: qty 28.35

## 2023-03-02 MED ORDER — LIDOCAINE 2% (20 MG/ML) 5 ML SYRINGE
INTRAMUSCULAR | Status: AC
  Filled 2023-03-02: qty 5

## 2023-03-02 MED ORDER — EMPAGLIFLOZIN 10 MG PO TABS
10.0000 mg | ORAL_TABLET | Freq: Every day | ORAL | Status: DC
  Administered 2023-03-03: 10 mg via ORAL
  Filled 2023-03-02: qty 1

## 2023-03-02 MED ORDER — FENTANYL CITRATE (PF) 100 MCG/2ML IJ SOLN
25.0000 ug | INTRAMUSCULAR | Status: DC | PRN
  Administered 2023-03-02: 25 ug via INTRAVENOUS

## 2023-03-02 SURGICAL SUPPLY — 44 items
APL SKNCLS STERI-STRIP NONHPOA (GAUZE/BANDAGES/DRESSINGS) ×1
BENZOIN TINCTURE PRP APPL 2/3 (GAUZE/BANDAGES/DRESSINGS) IMPLANT
CANISTER SUCT 3000ML PPV (MISCELLANEOUS) ×1 IMPLANT
CLEANER TIP ELECTROSURG 2X2 (MISCELLANEOUS) ×1 IMPLANT
CNTNR URN SCR LID CUP LEK RST (MISCELLANEOUS) ×2 IMPLANT
CONT SPEC 4OZ STRL OR WHT (MISCELLANEOUS) ×2
CORD BIPOLAR FORCEPS 12FT (ELECTRODE) ×1 IMPLANT
COVER SURGICAL LIGHT HANDLE (MISCELLANEOUS) ×1 IMPLANT
DRAIN JACKSON RD 7FR 3/32 (WOUND CARE) IMPLANT
DRAPE INCISE 13X13 STRL (DRAPES) ×1 IMPLANT
ELECT COATED BLADE 2.86 ST (ELECTRODE) ×1 IMPLANT
ELECT PAIRED SUBDERMAL (MISCELLANEOUS) ×1
ELECT REM PT RETURN 9FT ADLT (ELECTROSURGICAL) ×1
ELECTRODE PAIRED SUBDERMAL (MISCELLANEOUS) ×1 IMPLANT
ELECTRODE REM PT RTRN 9FT ADLT (ELECTROSURGICAL) ×1 IMPLANT
EVACUATOR SILICONE 100CC (DRAIN) ×1 IMPLANT
FORCEPS BIPOLAR SPETZLER 8 1.0 (NEUROSURGERY SUPPLIES) ×1 IMPLANT
GAUZE 4X4 16PLY ~~LOC~~+RFID DBL (SPONGE) ×1 IMPLANT
GLOVE BIO SURGEON STRL SZ7.5 (GLOVE) ×1 IMPLANT
GOWN STRL REUS W/ TWL LRG LVL3 (GOWN DISPOSABLE) ×2 IMPLANT
GOWN STRL REUS W/TWL LRG LVL3 (GOWN DISPOSABLE) ×2
KIT BASIN OR (CUSTOM PROCEDURE TRAY) ×1 IMPLANT
KIT TURNOVER KIT B (KITS) ×1 IMPLANT
LOOP VASCULAR MINI 18 RED (MISCELLANEOUS) ×1
NDL HYPO 25GX1X1/2 BEV (NEEDLE) ×1 IMPLANT
NEEDLE HYPO 25GX1X1/2 BEV (NEEDLE) ×1 IMPLANT
NS IRRIG 1000ML POUR BTL (IV SOLUTION) ×2 IMPLANT
PAD ARMBOARD 7.5X6 YLW CONV (MISCELLANEOUS) ×2 IMPLANT
PENCIL SMOKE EVACUATOR (MISCELLANEOUS) ×1 IMPLANT
POSITIONER HEAD DONUT 9IN (MISCELLANEOUS) IMPLANT
PROBE NERVBE PRASS .33 (MISCELLANEOUS) ×1 IMPLANT
SHEARS HARMONIC 9CM CVD (BLADE) ×1 IMPLANT
SPECIMEN JAR MEDIUM (MISCELLANEOUS) ×1 IMPLANT
STAPLER VISISTAT 35W (STAPLE) ×1 IMPLANT
SUT ETHILON 2 0 FS 18 (SUTURE) ×1 IMPLANT
SUT NYLON ETHILON 5-0 P-3 1X18 (SUTURE) ×1 IMPLANT
SUT SILK 2 0 PERMA HAND 18 BK (SUTURE) ×1 IMPLANT
SUT SILK 2 0 SH CR/8 (SUTURE) ×1 IMPLANT
SUT SILK 3 0 REEL (SUTURE) ×1 IMPLANT
SUT VIC AB 3-0 SH 27 (SUTURE) ×1
SUT VIC AB 3-0 SH 27X BRD (SUTURE) ×1 IMPLANT
SUT VIC AB 4-0 PS2 27 (SUTURE) IMPLANT
TRAY ENT MC OR (CUSTOM PROCEDURE TRAY) ×1 IMPLANT
VASCULAR TIE MINI RED 18IN STL (MISCELLANEOUS) ×1 IMPLANT

## 2023-03-02 NOTE — Anesthesia Procedure Notes (Signed)
Procedure Name: Intubation Date/Time: 03/02/2023 8:48 AM  Performed by: Sharyn Dross, CRNAPre-anesthesia Checklist: Patient identified, Emergency Drugs available, Suction available and Patient being monitored Patient Re-evaluated:Patient Re-evaluated prior to induction Oxygen Delivery Method: Circle system utilized Preoxygenation: Pre-oxygenation with 100% oxygen Induction Type: IV induction Ventilation: Mask ventilation without difficulty Laryngoscope Size: Mac and 4 Grade View: Grade I Tube type: Oral Tube size: 7.0 mm Number of attempts: 1 Airway Equipment and Method: Stylet and Oral airway Placement Confirmation: ETT inserted through vocal cords under direct vision, positive ETCO2 and breath sounds checked- equal and bilateral Secured at: 20 cm Tube secured with: Tape Dental Injury: Teeth and Oropharynx as per pre-operative assessment

## 2023-03-02 NOTE — Transfer of Care (Signed)
Immediate Anesthesia Transfer of Care Note  Patient: Alejandra Riley  Procedure(s) Performed: PAROTIDECTOMY (Right: Neck)  Patient Location: PACU  Anesthesia Type:General  Level of Consciousness: awake, alert , and oriented  Airway & Oxygen Therapy: Patient connected to face mask oxygen  Post-op Assessment: Report given to RN, Post -op Vital signs reviewed and stable, and Patient moving all extremities X 4  Post vital signs: Reviewed and stable  Last Vitals:  Vitals Value Taken Time  BP 112/72    Temp    Pulse 91 03/02/23 1044  Resp 20 03/02/23 1044  SpO2 100 % 03/02/23 1044  Vitals shown include unfiled device data.  Last Pain:  Vitals:   03/02/23 0651  TempSrc:   PainSc: 0-No pain      Patients Stated Pain Goal: 0 (03/02/23 8295)  Complications: There were no known notable events for this encounter.

## 2023-03-02 NOTE — Plan of Care (Signed)
  Problem: Activity: Goal: Ability to tolerate increased activity will improve Outcome: Progressing   Problem: Health Behavior/Discharge Planning: Goal: Identification of resources available to assist in meeting health care needs will improve Outcome: Progressing   Problem: Nutrition: Goal: Maintenance of adequate nutrition will improve Outcome: Progressing

## 2023-03-02 NOTE — Brief Op Note (Signed)
03/02/2023  10:32 AM  PATIENT:  Alejandra Riley  52 y.o. female  PRE-OPERATIVE DIAGNOSIS:  Benign neoplasm of parotid gland  POST-OPERATIVE DIAGNOSIS:  Benign neoplasm of parotid gland  PROCEDURE:  Procedure(s): PAROTIDECTOMY (Right)  SURGEON:  Surgeons and Role:    Christia Reading, MD - Primary  PHYSICIAN ASSISTANT: Clovis Cao  ASSISTANTS: none   ANESTHESIA:   general  EBL:  50 mL   BLOOD ADMINISTERED:none  DRAINS: (7 Fr) Jackson-Pratt drain(s) with closed bulb suction in the right neck    LOCAL MEDICATIONS USED:  LIDOCAINE   SPECIMEN:  Source of Specimen:  right superficial parotid gland  DISPOSITION OF SPECIMEN:  PATHOLOGY  COUNTS:  YES  TOURNIQUET:  * No tourniquets in log *  DICTATION: .Note written in EPIC  PLAN OF CARE: Admit for overnight observation  PATIENT DISPOSITION:  PACU - hemodynamically stable.   Delay start of Pharmacological VTE agent (>24hrs) due to surgical blood loss or risk of bleeding: no

## 2023-03-02 NOTE — Op Note (Signed)
Preop diagnosis: Right parotid neoplasm Postop diagnosis: same Procedure: Right superficial parotidectomy with dissection of facial nerve Surgeon: Jenne Pane Assist: Clovis Cao, PA, who was necessary to assist with retraction and closure. Anesth: General and local with 1% lidocaine with 1:100,000 epinephrine Compl: None Findings: Right parotid tail with 2 cm round mass Description:  After discussing risks, benefits, and alternatives, the patient was brought to the operative suite and placed on the operative table in the supine position.  Anesthesia was induced and the patient was intubated by the anesthesia team without difficulty.  The patient was given intravenous antibiotics.  The right parotid incision was marked with a marking pen and injected with local anesthetic.  The nerve integrity monitor was placed in the face for facial nerve monitoring and turned on during the case.  The right face and neck were prepped and draped in sterile fashion.  The incision was made with a 15 blade scalpel and extended through the subcutaneous and platysma layers with Bovie electrocautery.  A pre-parotid flap was elevated anteriorly and the earlobe was freed.  Skin flaps were sutured back with stay sutures.  Dissection was then performed anterior to the tragus and parotid tissue was elevated off of the mastoid.  With further dissection to the stylomastoid foramen, the main trunk of the facial nerve was identified.  The nerve was dissected to the main division and then down the inferior branches, dividing parotid tissue using bipolar electrocautery.  The marginal mandibular nerve was traced in an antegrade fashion until the inferior gland was able to be freed and the nerve kept intact.  The nerve stimulator was used for assistance.  The inferior parotid gland with mass was then dissected from surrounding tissues and removed.  This was passed to nursing for pathology.  The surgical site was then irrigated copiously with saline.   A 7 French suction drain was placed in the depth of the wound and secured at the skin with 2-0 Nylon with a standard drain stitch.  The flaps were released and laid back down.  The platysma/subcutaneous layer was closed with 4-0 Vicryl in a simple, running fashion.  The skin was closed with 5-0 Nylon in a simple, running fashion.  The drain was placed to bulb suction.  Bacitracin ointment was added to the incision.  Drapes were removed and the drain was secured to the shoulder with tape.  She was then returned to anesthesia for wake-up and was extubated and moved to the recovery room in stable condition.

## 2023-03-02 NOTE — H&P (Signed)
Alejandra Riley is an 52 y.o. female.   Chief Complaint: Right parotid neoplasm HPI: 52 year old female with right parotid mass presents for surgical management.  Past Medical History:  Diagnosis Date   Anxiety    Diabetes mellitus without complication (HCC)    High cholesterol    Left breast mass 09/20/2017   Benign   Migraines    Prediabetes 05/21/2021    Past Surgical History:  Procedure Laterality Date   TUBAL LIGATION  1999    Family History  Problem Relation Age of Onset   Vision loss Mother    Hypertension Mother    Depression Mother    Stroke Mother    Stomach cancer Father    Prostate cancer Father        Cause of death   Heart disease Maternal Grandmother    Hyperlipidemia Maternal Grandmother    Hypertension Maternal Grandmother    Diabetes Maternal Grandmother    Thyroid disease Maternal Grandmother    Breast cancer Maternal Grandmother    Other Son        club foot   Social History:  reports that she has been smoking cigarettes. She started smoking about 12 years ago. She has a 18.9 pack-year smoking history. She has never used smokeless tobacco. She reports that she does not currently use alcohol. She reports that she does not use drugs.  Allergies:  Allergies  Allergen Reactions   Amoxicillin Hives   Imitrex [Sumatriptan] Other (See Comments)    Arm weakness   Penicillins Hives and Other (See Comments)    Medications Prior to Admission  Medication Sig Dispense Refill   lisinopril (ZESTRIL) 2.5 MG tablet Take 1 tablet (2.5 mg total) by mouth daily. 90 tablet 3   potassium chloride (KLOR-CON) 10 MEQ tablet Take 3 tablets (30 mEq total) by mouth daily. 270 tablet 1   Rimegepant Sulfate (NURTEC) 75 MG TBDP Take 1 tablet (75 mg total) by mouth daily as needed. 8 tablet 6   rosuvastatin (CRESTOR) 10 MG tablet Take 1 tablet (10 mg total) by mouth daily. 90 tablet 3   empagliflozin (JARDIANCE) 10 MG TABS tablet Take 1 tablet (10 mg total) by mouth daily  before breakfast. (Patient taking differently: Take 10 mg by mouth daily before breakfast. Has not started) 90 tablet 3   metFORMIN (GLUCOPHAGE) 500 MG tablet Take 1 tablet (500 mg total) by mouth 2 (two) times daily with a meal. (Patient taking differently: Take 500 mg by mouth 2 (two) times daily with a meal. Has not started) 180 tablet 3   Semaglutide,0.25 or 0.5MG /DOS, 2 MG/3ML SOPN Inject 0.25 mg into the skin weekly for the next 4 weeks. After 4 weeks, increase to 0.5 mg weekly. (Patient taking differently: Inject 0.25 mg into the muscle once a week. Stopped taking 02/11/2023) 3 mL 3    Results for orders placed or performed during the hospital encounter of 03/02/23 (from the past 48 hour(s))  Glucose, capillary     Status: Abnormal   Collection Time: 03/02/23  6:35 AM  Result Value Ref Range   Glucose-Capillary 135 (H) 70 - 99 mg/dL    Comment: Glucose reference range applies only to samples taken after fasting for at least 8 hours.   No results found.  Review of Systems  All other systems reviewed and are negative.   Blood pressure 125/83, pulse 75, temperature 98.4 F (36.9 C), temperature source Oral, resp. rate 16, height 5\' 7"  (1.702 m), weight 79.4 kg,  last menstrual period 05/20/2017, SpO2 98%. Physical Exam Constitutional:      Appearance: Normal appearance. She is normal weight.  HENT:     Head: Normocephalic and atraumatic.     Right Ear: External ear normal.     Left Ear: External ear normal.     Nose: Nose normal.     Mouth/Throat:     Mouth: Mucous membranes are moist.     Pharynx: Oropharynx is clear.  Eyes:     Extraocular Movements: Extraocular movements intact.     Conjunctiva/sclera: Conjunctivae normal.     Pupils: Pupils are equal, round, and reactive to light.  Neck:     Comments: Right parotid tail with 2 cm mass. Cardiovascular:     Rate and Rhythm: Normal rate.  Pulmonary:     Effort: Pulmonary effort is normal.  Musculoskeletal:     Cervical  back: Normal range of motion.  Skin:    General: Skin is warm and dry.  Neurological:     General: No focal deficit present.     Mental Status: She is alert and oriented to person, place, and time.  Psychiatric:        Mood and Affect: Mood normal.        Behavior: Behavior normal.        Thought Content: Thought content normal.        Judgment: Judgment normal.      Assessment/Plan Right parotid neoplasm  To OR for right parotidectomy.  Christia Reading, MD 03/02/2023, 8:24 AM

## 2023-03-03 ENCOUNTER — Encounter (HOSPITAL_COMMUNITY): Payer: Self-pay | Admitting: Otolaryngology

## 2023-03-03 DIAGNOSIS — C07 Malignant neoplasm of parotid gland: Secondary | ICD-10-CM | POA: Diagnosis not present

## 2023-03-03 LAB — GLUCOSE, CAPILLARY: Glucose-Capillary: 115 mg/dL — ABNORMAL HIGH (ref 70–99)

## 2023-03-03 MED ORDER — HYDROCODONE-ACETAMINOPHEN 5-325 MG PO TABS: 1.0000 | ORAL_TABLET | Freq: Four times a day (QID) | ORAL | 0 refills | Status: DC | PRN

## 2023-03-03 NOTE — Progress Notes (Signed)
Patient complains of swelling to her surgical site. No hematoma noted on her neck . On call provider notified. Moderate amount of blood noted in her JP drain . She denies any pain or difficulty swallowing.

## 2023-03-03 NOTE — Anesthesia Postprocedure Evaluation (Signed)
Anesthesia Post Note  Patient: Alejandra Riley  Procedure(s) Performed: PAROTIDECTOMY (Right: Neck)     Patient location during evaluation: PACU Anesthesia Type: General Level of consciousness: awake and alert Pain management: pain level controlled Vital Signs Assessment: post-procedure vital signs reviewed and stable Respiratory status: spontaneous breathing, nonlabored ventilation, respiratory function stable and patient connected to nasal cannula oxygen Cardiovascular status: blood pressure returned to baseline and stable Postop Assessment: no apparent nausea or vomiting Anesthetic complications: no   There were no known notable events for this encounter.  Last Vitals:  Vitals:   03/03/23 0459 03/03/23 0758  BP: (!) 118/59 108/62  Pulse: (!) 43 (!) 45  Resp:  14  Temp: 36.4 C 36.5 C  SpO2: 98% 99%    Last Pain:  Vitals:   03/03/23 0832  TempSrc:   PainSc: 0-No pain                 Collene Schlichter

## 2023-03-03 NOTE — Plan of Care (Signed)
  Problem: Education: Goal: Knowledge of the prescribed therapeutic regimen will improve Outcome: Adequate for Discharge   Problem: Activity: Goal: Ability to tolerate increased activity will improve Outcome: Adequate for Discharge   Problem: Health Behavior/Discharge Planning: Goal: Identification of resources available to assist in meeting health care needs will improve Outcome: Adequate for Discharge   Problem: Nutrition: Goal: Maintenance of adequate nutrition will improve Outcome: Adequate for Discharge   Problem: Clinical Measurements: Goal: Complications related to the disease process, condition or treatment will be avoided or minimized Outcome: Adequate for Discharge   Problem: Respiratory: Goal: Will regain and/or maintain adequate ventilation Outcome: Adequate for Discharge   Problem: Skin Integrity: Goal: Demonstration of wound healing without infection will improve Outcome: Adequate for Discharge   Problem: Education: Goal: Knowledge of General Education information will improve Description: Including pain rating scale, medication(s)/side effects and non-pharmacologic comfort measures Outcome: Adequate for Discharge   Problem: Health Behavior/Discharge Planning: Goal: Ability to manage health-related needs will improve Outcome: Adequate for Discharge   Problem: Clinical Measurements: Goal: Ability to maintain clinical measurements within normal limits will improve Outcome: Adequate for Discharge Goal: Will remain free from infection Outcome: Adequate for Discharge Goal: Diagnostic test results will improve Outcome: Adequate for Discharge Goal: Respiratory complications will improve Outcome: Adequate for Discharge Goal: Cardiovascular complication will be avoided Outcome: Adequate for Discharge   Problem: Activity: Goal: Risk for activity intolerance will decrease Outcome: Adequate for Discharge   Problem: Nutrition: Goal: Adequate nutrition will be  maintained Outcome: Adequate for Discharge   Problem: Coping: Goal: Level of anxiety will decrease Outcome: Adequate for Discharge   Problem: Elimination: Goal: Will not experience complications related to bowel motility Outcome: Adequate for Discharge Goal: Will not experience complications related to urinary retention Outcome: Adequate for Discharge   Problem: Pain Managment: Goal: General experience of comfort will improve Outcome: Adequate for Discharge   Problem: Safety: Goal: Ability to remain free from injury will improve Outcome: Adequate for Discharge   Problem: Skin Integrity: Goal: Risk for impaired skin integrity will decrease Outcome: Adequate for Discharge   

## 2023-03-03 NOTE — Discharge Summary (Signed)
Physician Discharge Summary  Patient ID: Alejandra Riley MRN: [id number] DOB/AGE: 10/29/1970 52 y.o.  Admit date: 03/02/2023 Discharge date: 03/03/2023  Admission Diagnoses: Parotid neoplasm  Discharge Diagnoses:  Principal Problem:   Parotid neoplasm   Discharged Condition: good  Hospital Course: 52 year old female with right parotid mass presented for surgical management.  See operative note.  She was observed overnight after surgery with a drain in place and did quite well.  On POD 1, the drain was removed and she is felt stable for discharge home.  Consults: None  Significant Diagnostic Studies: None  Treatments: surgery: Right parotidectomy  Discharge Exam: Blood pressure (!) 118/59, pulse (!) 43, temperature 97.6 F (36.4 C), resp. rate 18, height 5\' 7"  (1.702 m), weight 79.4 kg, last menstrual period 05/20/2017, SpO2 98%. General appearance: alert, cooperative, and no distress Neck: right parotid incision clean and intact, no fluid collection, drain removed, normal facial function  Disposition: Discharge disposition: 01-Home or Self Care       Discharge Instructions     Diet - low sodium heart healthy   Complete by: As directed    Discharge instructions   Complete by: As directed    Bland diet only.  Avoid strenuous activity.  Apply antibiotic ointment to incision twice daily.   Increase activity slowly   Complete by: As directed       Allergies as of 03/03/2023       Reactions   Amoxicillin Hives   Imitrex [sumatriptan] Other (See Comments)   Arm weakness   Penicillins Hives, Other (See Comments)        Medication List     TAKE these medications    empagliflozin 10 MG Tabs tablet Commonly known as: Jardiance Take 1 tablet (10 mg total) by mouth daily before breakfast. What changed: additional instructions   HYDROcodone-acetaminophen 5-325 MG tablet Commonly known as: NORCO/VICODIN Take 1-2 tablets by mouth every 6 (six) hours as needed for  moderate pain.   lisinopril 2.5 MG tablet Commonly known as: ZESTRIL Take 1 tablet (2.5 mg total) by mouth daily.   metFORMIN 500 MG tablet Commonly known as: GLUCOPHAGE Take 1 tablet (500 mg total) by mouth 2 (two) times daily with a meal. What changed: additional instructions   Nurtec 75 MG Tbdp Generic drug: Rimegepant Sulfate Take 1 tablet (75 mg total) by mouth daily as needed.   potassium chloride 10 MEQ tablet Commonly known as: KLOR-CON Take 3 tablets (30 mEq total) by mouth daily.   rosuvastatin 10 MG tablet Commonly known as: Crestor Take 1 tablet (10 mg total) by mouth daily.   Semaglutide(0.25 or 0.5MG /DOS) 2 MG/3ML Sopn Inject 0.25 mg into the skin weekly for the next 4 weeks. After 4 weeks, increase to 0.5 mg weekly. What changed:  how much to take how to take this when to take this additional instructions        Follow-up Information     Christia Reading, MD. Schedule an appointment as soon as possible for a visit in 1 week(s).   Specialty: Otolaryngology Contact information: [address] [place] [address] [phone]                 Signed: Christia Reading 03/03/2023, 7:50 AM

## 2023-03-03 NOTE — Progress Notes (Signed)
   03/03/23 0934  SDOH Interventions  Food Insecurity Interventions Inpatient TOC;Intervention Not Indicated;Other (Comment) (Pt states no concerns at this time.)   CSW noted pt had been flagged for positive SDOH needs. CSW met with pt and sister at bedside Pt notes no concerns with food insecurity or financial strain. Pt states she has anxiety about bills, but states it is similar to what most people experience. Pt states she has mild depression, but is not currently experiencing any symptoms. Pt denies any thoughts of harming herself or other. Pt notes no concerns or needs at this time.

## 2023-03-03 NOTE — Progress Notes (Addendum)
Discharge instructions, medications, and follow-up appts reviewed with pt. at this time; pt. voices understanding of all instructions given. PIVs to left & right hand removed and dry dressings. Catheter tips intact and hemostasis achieved within expected timeframe. All belongings to be sent with pt. at time of discharge.

## 2023-03-03 NOTE — Progress Notes (Signed)
Pt. D/C'd home in stable condition via personal vehicle at this time.

## 2023-03-04 ENCOUNTER — Telehealth: Payer: Self-pay

## 2023-03-04 NOTE — Transitions of Care (Post Inpatient/ED Visit) (Signed)
03/04/2023  Name: Alejandra Riley MRN: [id number] DOB: November 16, 1970  Today's TOC FU Call Status: Today's TOC FU Call Status:: Successful TOC FU Call Completed TOC FU Call Complete Date: 03/04/23  Transition Care Management Follow-up Telephone Call Date of Discharge: 03/03/23 Discharge Facility: [facility]) Type of Discharge: Inpatient Admission Primary Inpatient Discharge Diagnosis:: neoplasm How have you been since you were released from the hospital?: Better Any questions or concerns?: No  Items Reviewed: Did you receive and understand the discharge instructions provided?: No Medications obtained,verified, and reconciled?: Yes (Medications Reviewed) Any new allergies since your discharge?: No Dietary orders reviewed?: Yes Do you have support at home?: Yes People in Home: spouse, sibling(s)  Medications Reviewed Today: Medications Reviewed Today     Reviewed by Karena Addison, LPN (Licensed Practical Nurse) on 03/04/23 at 1007  Med List Status: <None>   Medication Order Taking? Sig Documenting Provider Last Dose Status Informant  empagliflozin (JARDIANCE) 10 MG TABS tablet [id number]  Take 1 tablet (10 mg total) by mouth daily before breakfast.  Patient taking differently: Take 10 mg by mouth daily before breakfast. Has not started   Gabriel Earing, FNP  Active   HYDROcodone-acetaminophen (NORCO/VICODIN) 5-325 MG tablet [id number]  Take 1-2 tablets by mouth every 6 (six) hours as needed for moderate pain. Christia Reading, MD  Active   lisinopril (ZESTRIL) 2.5 MG tablet [id number] No Take 1 tablet (2.5 mg total) by mouth daily. Gabriel Earing, FNP Past Week Active Self  metFORMIN (GLUCOPHAGE) 500 MG tablet [id number]  Take 1 tablet (500 mg total) by mouth 2 (two) times daily with a meal.  Patient taking differently: Take 500 mg by mouth 2 (two) times daily with a meal. Has not started   Gabriel Earing, FNP  Active Self           Med Note Gunnar Fusi, MELISSA R   Wed Feb 23, 2023  1:53 PM) New Med - Received 02/23/23  potassium chloride (KLOR-CON) 10 MEQ tablet [id number] No Take 3 tablets (30 mEq total) by mouth daily. Gabriel Earing, FNP 03/02/2023 Active Self  Rimegepant Sulfate (NURTEC) 75 MG TBDP [id number] No Take 1 tablet (75 mg total) by mouth daily as needed. Gabriel Earing, FNP Past Week Active Self  rosuvastatin (CRESTOR) 10 MG tablet [id number] No Take 1 tablet (10 mg total) by mouth daily. Gabriel Earing, FNP 03/02/2023 Active Self  Semaglutide,0.25 or 0.5MG /DOS, 2 MG/3ML SOPN [id number] No Inject 0.25 mg into the skin weekly for the next 4 weeks. After 4 weeks, increase to 0.5 mg weekly.  Patient taking differently: Inject 0.25 mg into the muscle once a week. Stopped taking 02/11/2023   Gabriel Earing, FNP 02/11/2023 Active Self           Med Note Gunnar Fusi, MELISSA R   Wed Feb 23, 2023  1:54 PM) Med ON HOLD             Home Care and Equipment/Supplies: Were Home Health Services Ordered?: NA Any new equipment or medical supplies ordered?: NA  Functional Questionnaire: Do you need assistance with bathing/showering or dressing?: No Do you need assistance with meal preparation?: No Do you need assistance with eating?: No Do you have difficulty maintaining continence: No Do you need assistance with getting out of bed/getting out of a chair/moving?: No Do you have difficulty managing or taking your medications?: No  Follow up appointments reviewed: PCP Follow-up appointment confirmed?: NA Specialist Hospital Follow-up appointment confirmed?: Yes Date of  Specialist follow-up appointment?: 03/10/23 Follow-Up Specialty Provider:: ENT Do you need transportation to your follow-up appointment?: No Do you understand care options if your condition(s) worsen?: Yes-patient verbalized understanding    SIGNATURE Karena Addison, LPN [facility] Nurse Health Advisor Direct Dial [phone]

## 2023-03-10 ENCOUNTER — Telehealth: Payer: Self-pay | Admitting: Family Medicine

## 2023-03-11 NOTE — Telephone Encounter (Signed)
Discussed with PCP If patient is negative for and denies personal or family history of medullary thyroid carcinoma (MTC) or in patients with Multiple Endocrine Neoplasia syndrome type 2 (MEN 2), then okay to start/resume GLP1  Routing to PCP for documentation purposes only Will close encounter

## 2023-03-31 ENCOUNTER — Ambulatory Visit (INDEPENDENT_AMBULATORY_CARE_PROVIDER_SITE_OTHER): Payer: Managed Care, Other (non HMO) | Admitting: Family Medicine

## 2023-03-31 ENCOUNTER — Encounter: Payer: Self-pay | Admitting: Family Medicine

## 2023-03-31 VITALS — BP 99/69 | HR 74 | Temp 98.4°F | Ht 67.0 in | Wt 163.5 lb

## 2023-03-31 DIAGNOSIS — E876 Hypokalemia: Secondary | ICD-10-CM

## 2023-03-31 DIAGNOSIS — K219 Gastro-esophageal reflux disease without esophagitis: Secondary | ICD-10-CM

## 2023-03-31 DIAGNOSIS — E1165 Type 2 diabetes mellitus with hyperglycemia: Secondary | ICD-10-CM | POA: Diagnosis not present

## 2023-03-31 DIAGNOSIS — Z7985 Long-term (current) use of injectable non-insulin antidiabetic drugs: Secondary | ICD-10-CM

## 2023-03-31 MED ORDER — POTASSIUM CHLORIDE ER 10 MEQ PO TBCR
30.0000 meq | EXTENDED_RELEASE_TABLET | Freq: Every day | ORAL | 1 refills | Status: DC
Start: 2023-03-31 — End: 2023-08-10

## 2023-03-31 MED ORDER — FAMOTIDINE 40 MG PO TABS: 40.0000 mg | ORAL_TABLET | Freq: Every day | ORAL | 0 refills | Status: DC

## 2023-03-31 MED ORDER — OMEPRAZOLE 40 MG PO CPDR: 40.0000 mg | DELAYED_RELEASE_CAPSULE | Freq: Every day | ORAL | 3 refills | Status: DC

## 2023-03-31 NOTE — Progress Notes (Signed)
Acute Office Visit  Subjective:     Patient ID: Alejandra Riley, female    DOB: [birth year], 52 y.o.   MRN: [id number]  Chief Complaint  Patient presents with   Heartburn    Heartburn She complains of belching (with bad taste), heartburn and nausea (intermittent, mild). She reports no abdominal pain, no chest pain, no choking, no coughing, no dysphagia, no hoarse voice, no sore throat or no water brash. This is a new problem. Episode onset: 2 weeks ago after starting ozempic. The problem has been gradually worsening. The symptoms are aggravated by lying down (eating). Treatments tried: tagamet, pepcid, omeprazole. The treatment provided no relief.   She started ozempic 3 weeks ago. Unfortunately she misread the instructions and started with the 0.5 mg dosage.   She also needs a refill on potassium today.   Review of Systems  HENT:  Negative for hoarse voice and sore throat.   Respiratory:  Negative for cough and choking.   Cardiovascular:  Negative for chest pain.  Gastrointestinal:  Positive for heartburn and nausea (intermittent, mild). Negative for abdominal pain and dysphagia.        Objective:    BP 99/69   Pulse 74   Temp 98.4 F (36.9 C) (Temporal)   Ht 5\' 7"  (1.702 m)   Wt 163 lb 8 oz (74.2 kg)   LMP 05/20/2017 Comment: tubal ligation  SpO2 99%   BMI 25.61 kg/m    Physical Exam Vitals and nursing note reviewed.  Constitutional:      General: She is not in acute distress.    Appearance: Normal appearance. She is not ill-appearing, toxic-appearing or diaphoretic.  Cardiovascular:     Rate and Rhythm: Normal rate and regular rhythm.     Heart sounds: Normal heart sounds. No murmur heard. Pulmonary:     Effort: Pulmonary effort is normal. No respiratory distress.     Breath sounds: Normal breath sounds.  Abdominal:     General: There is no distension.     Palpations: Abdomen is soft.     Tenderness: There is no abdominal tenderness. There is no guarding or  rebound.  Musculoskeletal:     Right lower leg: No edema.     Left lower leg: No edema.  Skin:    General: Skin is warm and dry.  Neurological:     General: No focal deficit present.     Mental Status: She is alert and oriented to person, place, and time.  Psychiatric:        Mood and Affect: Mood normal.        Behavior: Behavior normal.     No results found for any visits on 03/31/23.      Assessment & Plan:   Rakeya Adem" was seen today for heartburn.  Diagnoses and all orders for this visit:  Gastroesophageal reflux disease, unspecified whether esophagitis present Discussed to decrease back to starting dose of 0.25 mg weekly dosage, then increase back to 0.5 mg weekly after a few weeks at starting dosage. Increase omeprazole to 40 mg daily. Pepcid at bedtime.  -     famotidine (PEPCID) 40 MG tablet; Take 1 tablet (40 mg total) by mouth at bedtime. -     omeprazole (PRILOSEC) 40 MG capsule; Take 1 capsule (40 mg total) by mouth daily.  Type 2 diabetes mellitus with hyperglycemia, without long-term current use of insulin (HCC) Long-term current use of injectable noninsulin antidiabetic medication She would like to continue ozempic for now  to see if this improves. She will left me know if side effects worsen or if she wishes to try another medication.   Hypokalemia Refill provided.  -     potassium chloride (KLOR-CON) 10 MEQ tablet; Take 3 tablets (30 mEq total) by mouth daily.  Keep scheduled follow up.   The patient indicates understanding of these issues and agrees with the plan.  Gabriel Earing, FNP

## 2023-04-06 ENCOUNTER — Encounter: Payer: Self-pay | Admitting: Family Medicine

## 2023-04-08 ENCOUNTER — Telehealth: Payer: Self-pay | Admitting: *Deleted

## 2023-04-08 ENCOUNTER — Other Ambulatory Visit: Payer: Self-pay | Admitting: Family Medicine

## 2023-04-08 ENCOUNTER — Encounter: Payer: Self-pay | Admitting: Family Medicine

## 2023-04-08 DIAGNOSIS — E1165 Type 2 diabetes mellitus with hyperglycemia: Secondary | ICD-10-CM

## 2023-04-08 MED ORDER — TIRZEPATIDE 5 MG/0.5ML ~~LOC~~ SOAJ: 5.0000 mg | SUBCUTANEOUS | 3 refills | Status: DC

## 2023-04-08 MED ORDER — TIRZEPATIDE 2.5 MG/0.5ML ~~LOC~~ SOAJ
2.5000 mg | SUBCUTANEOUS | 0 refills | Status: DC
Start: 2023-04-08 — End: 2023-07-15

## 2023-04-08 NOTE — Telephone Encounter (Signed)
Prior Authorization submitted through [employer]  Prior Auth (EOC) ID: [id number]  Drug/Service Name: Greggory Keen 2.5 MG/0.5 ML PEN  Patient: Alejandra Riley  DOB: 08/11/1970   Date Requested: 04/08/2023 3:29:54 PM    MemberID: [id number]

## 2023-04-11 NOTE — Telephone Encounter (Signed)
Tried to get the status of PA, could not pull up. Came back no results found. Resubmitted PA      Per test claim: PA required; PA submitted to [employer] via Prompt PA Key/confirmation #/EOC [id number] Status is pending

## 2023-04-12 ENCOUNTER — Other Ambulatory Visit (HOSPITAL_COMMUNITY): Payer: Self-pay

## 2023-04-12 NOTE — Telephone Encounter (Signed)
Pharmacy Patient Advocate Encounter  Received notification from [employer] that Prior Authorization for Alejandra Riley has been CANCELLED due to   PA #/Case ID/Reference #: [id number]

## 2023-04-13 ENCOUNTER — Telehealth: Payer: Self-pay | Admitting: Family Medicine

## 2023-04-13 NOTE — Telephone Encounter (Signed)
Alejandra Riley was a change in therapy and the patient has not been prescribed this before.

## 2023-04-18 ENCOUNTER — Ambulatory Visit: Payer: Managed Care, Other (non HMO)

## 2023-04-18 DIAGNOSIS — E1165 Type 2 diabetes mellitus with hyperglycemia: Secondary | ICD-10-CM | POA: Diagnosis not present

## 2023-04-18 LAB — HM DIABETES EYE EXAM

## 2023-04-18 NOTE — Progress Notes (Signed)
Alejandra Riley arrived 04/18/2023 and has given verbal consent to obtain images and complete their overdue diabetic retinal screening.  The images have been sent to an ophthalmologist or optometrist for review and interpretation.  Results will be sent back to Alejandra Earing, FNP for review.  Patient has been informed they will be contacted when we receive the results via telephone or MyChart

## 2023-04-27 ENCOUNTER — Ambulatory Visit: Payer: Managed Care, Other (non HMO) | Admitting: Family Medicine

## 2023-05-09 ENCOUNTER — Ambulatory Visit (INDEPENDENT_AMBULATORY_CARE_PROVIDER_SITE_OTHER): Payer: Managed Care, Other (non HMO) | Admitting: Family Medicine

## 2023-05-09 VITALS — BP 124/88 | HR 78 | Temp 98.2°F | Resp 20 | Ht 67.0 in | Wt 163.0 lb

## 2023-05-09 DIAGNOSIS — K8020 Calculus of gallbladder without cholecystitis without obstruction: Secondary | ICD-10-CM | POA: Diagnosis not present

## 2023-05-09 DIAGNOSIS — E1165 Type 2 diabetes mellitus with hyperglycemia: Secondary | ICD-10-CM | POA: Diagnosis not present

## 2023-05-09 DIAGNOSIS — E785 Hyperlipidemia, unspecified: Secondary | ICD-10-CM

## 2023-05-09 DIAGNOSIS — K219 Gastro-esophageal reflux disease without esophagitis: Secondary | ICD-10-CM | POA: Diagnosis not present

## 2023-05-09 DIAGNOSIS — E1169 Type 2 diabetes mellitus with other specified complication: Secondary | ICD-10-CM

## 2023-05-09 DIAGNOSIS — Z7985 Long-term (current) use of injectable non-insulin antidiabetic drugs: Secondary | ICD-10-CM

## 2023-05-09 LAB — BAYER DCA HB A1C WAIVED: HB A1C (BAYER DCA - WAIVED): 6.3 % — ABNORMAL HIGH (ref 4.8–5.6)

## 2023-05-09 NOTE — Progress Notes (Unsigned)
Established Patient Office Visit  Subjective   Patient ID: Alejandra Riley, female    DOB: Mar 11, 1971  Age: 52 y.o. MRN: [id number]  Chief Complaint  Patient presents with   Medical Management of Chronic Issues    HPI  T2DM Pt presents for initial evaluation of Type 2 diabetes mellitus. Patient denies foot ulcerations, increased appetite, nausea, paresthesia of the feet, polydipsia, polyuria, visual disturbances, vomiting, and weight loss.  Current diabetic medications include: mounjaro 2.5 mg. She tolerated one dose without side effects. She will be havin her gallbladder removed tomorrow so she had to hold her injection yesterday.   Current monitoring regimen: none  2. HLD She started crestor and has been compliant. Denies side effects.    3. GERD Compliant with prilosec. She is having her gall bladder removed tomorrow and hopes that this will also improve her symptoms.   Past Medical History:  Diagnosis Date   Anxiety    Diabetes mellitus without complication (HCC)    High cholesterol    Left breast mass 09/20/2017   Benign   Migraines    Prediabetes 05/21/2021    ROS As per HPI.    Objective:     BP 124/88   Pulse 78   Temp 98.2 F (36.8 C) (Temporal)   Resp 20   Ht 5\' 7"  (1.702 m)   Wt 163 lb (73.9 kg)   LMP 05/20/2017 Comment: tubal ligation  SpO2 98%   BMI 25.53 kg/m    Physical Exam Vitals and nursing note reviewed.  Constitutional:      General: She is not in acute distress.    Appearance: Normal appearance. She is not ill-appearing, toxic-appearing or diaphoretic.  HENT:     Mouth/Throat:     Tonsils: No tonsillar exudate or tonsillar abscesses. 1+ on the right. 1+ on the left.  Neck:     Thyroid: No thyroid mass, thyromegaly or thyroid tenderness.  Cardiovascular:     Rate and Rhythm: Normal rate and regular rhythm.     Heart sounds: Normal heart sounds. No murmur heard. Pulmonary:     Effort: Pulmonary effort is normal. No respiratory  distress.     Breath sounds: Normal breath sounds.  Musculoskeletal:     Cervical back: No edema, erythema or rigidity. Normal range of motion.     Right lower leg: No edema.     Left lower leg: No edema.  Skin:    General: Skin is warm.  Neurological:     General: No focal deficit present.     Mental Status: She is alert and oriented to person, place, and time.  Psychiatric:        Mood and Affect: Mood normal.        Behavior: Behavior normal.      No results found for any visits on 05/09/23.    The 10-year ASCVD risk score (Arnett DK, et al., 2019) is: 6.2%    Assessment & Plan:   Alejandra Riley" was seen today for medical management of chronic issues.  Diagnoses and all orders for this visit:  Type 2 diabetes mellitus with hyperglycemia, without long-term current use of insulin (HCC) A1c 6.3 today, at goal of <7. Medication changes today: restart mounjaro after surgery. She is on an ACE/ARB and statin. Diet and exercise.  -     Bayer DCA Hb A1c Waived  Hyperlipidemia associated with type 2 diabetes mellitus (HCC) Fasting panel pending.  -     CBC with Differential/Platelet -  CMP14+EGFR -     Lipid panel  Gastroesophageal reflux disease, unspecified whether esophagitis present Continue omeprazole.   Calculus of gallbladder without cholecystitis without obstruction Surgery scheduled for tomorrow.    Return in about 3 months (around 08/09/2023) for chronic follow up.   The patient indicates understanding of these issues and agrees with the plan.  Gabriel Earing, FNP

## 2023-05-10 LAB — CMP14+EGFR
ALT: 7 [IU]/L (ref 0–32)
AST: 10 [IU]/L (ref 0–40)
Albumin: 4.2 g/dL (ref 3.8–4.9)
Alkaline Phosphatase: 124 [IU]/L — ABNORMAL HIGH (ref 44–121)
BUN/Creatinine Ratio: 3 — ABNORMAL LOW (ref 9–23)
BUN: 3 mg/dL — ABNORMAL LOW (ref 6–24)
Bilirubin Total: <0.2 mg/dL (ref 0.0–1.2)
CO2: 26 mmol/L (ref 20–29)
Calcium: 9.7 mg/dL (ref 8.7–10.2)
Chloride: 99 mmol/L (ref 96–106)
Creatinine, Ser: 0.91 mg/dL (ref 0.57–1.00)
Globulin, Total: 2.8 g/dL (ref 1.5–4.5)
Glucose: 101 mg/dL — ABNORMAL HIGH (ref 70–99)
Potassium: 3.8 mmol/L (ref 3.5–5.2)
Sodium: 140 mmol/L (ref 134–144)
Total Protein: 7 g/dL (ref 6.0–8.5)
eGFR: 76 mL/min/{1.73_m2} (ref >59)

## 2023-05-10 LAB — CBC WITH DIFFERENTIAL/PLATELET
Basophils Absolute: 0.1 10*3/uL (ref 0.0–0.2)
Basos: 1 %
EOS (ABSOLUTE): 0.2 10*3/uL (ref 0.0–0.4)
Eos: 2 %
Hematocrit: 40 % (ref 34.0–46.6)
Hemoglobin: 13 g/dL (ref 11.1–15.9)
Immature Grans (Abs): 0 10*3/uL (ref 0.0–0.1)
Immature Granulocytes: 0 %
Lymphocytes Absolute: 2.6 10*3/uL (ref 0.7–3.1)
Lymphs: 24 %
MCH: 26.5 pg — ABNORMAL LOW (ref 26.6–33.0)
MCHC: 32.5 g/dL (ref 31.5–35.7)
MCV: 82 fL (ref 79–97)
Monocytes Absolute: 0.8 10*3/uL (ref 0.1–0.9)
Monocytes: 7 %
Neutrophils Absolute: 7.5 10*3/uL — ABNORMAL HIGH (ref 1.4–7.0)
Neutrophils: 66 %
Platelets: 414 10*3/uL (ref 150–450)
RBC: 4.91 x10E6/uL (ref 3.77–5.28)
RDW: 13.4 % (ref 11.7–15.4)
WBC: 11.2 10*3/uL — ABNORMAL HIGH (ref 3.4–10.8)

## 2023-05-10 LAB — LIPID PANEL
Chol/HDL Ratio: 2.4 {ratio} (ref 0.0–4.4)
Cholesterol, Total: 113 mg/dL (ref 100–199)
HDL: 48 mg/dL (ref >39)
LDL Chol Calc (NIH): 42 mg/dL (ref 0–99)
Triglycerides: 133 mg/dL (ref 0–149)
VLDL Cholesterol Cal: 23 mg/dL (ref 5–40)

## 2023-07-11 ENCOUNTER — Ambulatory Visit: Payer: Managed Care, Other (non HMO) | Admitting: Family Medicine

## 2023-07-15 ENCOUNTER — Encounter: Payer: Self-pay | Admitting: Family Medicine

## 2023-07-15 ENCOUNTER — Ambulatory Visit: Payer: Medicaid Other | Admitting: Family Medicine

## 2023-07-15 VITALS — BP 94/63 | HR 72 | Temp 97.7°F | Ht 67.0 in | Wt 152.2 lb

## 2023-07-15 DIAGNOSIS — E1165 Type 2 diabetes mellitus with hyperglycemia: Secondary | ICD-10-CM | POA: Diagnosis not present

## 2023-07-15 DIAGNOSIS — N3000 Acute cystitis without hematuria: Secondary | ICD-10-CM | POA: Diagnosis not present

## 2023-07-15 DIAGNOSIS — R1030 Lower abdominal pain, unspecified: Secondary | ICD-10-CM

## 2023-07-15 DIAGNOSIS — Z9049 Acquired absence of other specified parts of digestive tract: Secondary | ICD-10-CM | POA: Diagnosis not present

## 2023-07-15 DIAGNOSIS — R195 Other fecal abnormalities: Secondary | ICD-10-CM

## 2023-07-15 DIAGNOSIS — R300 Dysuria: Secondary | ICD-10-CM | POA: Diagnosis not present

## 2023-07-15 DIAGNOSIS — F4321 Adjustment disorder with depressed mood: Secondary | ICD-10-CM

## 2023-07-15 DIAGNOSIS — Z7985 Long-term (current) use of injectable non-insulin antidiabetic drugs: Secondary | ICD-10-CM

## 2023-07-15 LAB — MICROSCOPIC EXAMINATION
Renal Epithel, UA: NONE SEEN /[HPF]
WBC, UA: >30 /[HPF] — AB (ref 0–5)

## 2023-07-15 LAB — URINALYSIS, ROUTINE W REFLEX MICROSCOPIC
Bilirubin, UA: NEGATIVE
Glucose, UA: NEGATIVE
Ketones, UA: NEGATIVE
Nitrite, UA: NEGATIVE
Protein,UA: NEGATIVE
Specific Gravity, UA: 1.01 (ref 1.005–1.030)
Urobilinogen, Ur: 0.2 mg/dL (ref 0.2–1.0)
pH, UA: 6 (ref 5.0–7.5)

## 2023-07-15 MED ORDER — SULFAMETHOXAZOLE-TRIMETHOPRIM 800-160 MG PO TABS
1.0000 | ORAL_TABLET | Freq: Two times a day (BID) | ORAL | 0 refills | Status: AC
Start: 2023-07-15 — End: 2023-07-22

## 2023-07-15 NOTE — Progress Notes (Signed)
Acute Office Visit  Subjective:     Patient ID: Alejandra Riley, female    DOB: [birth year], 52 y.o.   MRN: [id number]  Chief Complaint  Patient presents with   Stool Color Change   Dysuria    Dysuria  This is a new problem. The current episode started in the past 7 days. The problem occurs every urination. The problem has been gradually worsening. The quality of the pain is described as burning. There has been no fever. Associated symptoms include chills, frequency, hematuria (once, now resolved), nausea, urgency and vomiting (1 last week). Pertinent negatives include no discharge or flank pain. There is no history of kidney stones.   She has had white stool 3x in the last month. Intermittent with normal stools. She has had constipation that has been resolved now with colace. Lower abdominal cramping that is intermittent. Daily nausea that occurs at least 1x a day that lasts for a few mins. Vomiting x1 last week- NBNB. Currently on mounjaro 5 mg. She is having some heartburn. She is taking omeprazole with some relief. Recently had gallbladder removed. She started mounjaro about 6 weeks ago. Currently on 5 mg dosage. She previously did not tolerated ozempic due to side effects.   Recently lost her job and she has found this stressful. Family has been supportive and she is working on looking for another job.      07/15/2023    1:36 PM 05/09/2023    3:58 PM 01/24/2023    2:53 PM  Depression screen PHQ 2/9  Decreased Interest 2 3 2   Down, Depressed, Hopeless 2 1 1   PHQ - 2 Score 4 4 3   Altered sleeping 1 2 3   Tired, decreased energy 3 3 3   Change in appetite 0 2 1  Feeling bad or failure about yourself  1 0 0  Trouble concentrating 2 0 2  Moving slowly or fidgety/restless 2 1 0  Suicidal thoughts 0 0 0  PHQ-9 Score 13 12 12   Difficult doing work/chores Somewhat difficult Not difficult at all Somewhat difficult      05/09/2023    3:59 PM 01/24/2023    2:53 PM 09/29/2022    3:46 PM  10/16/2021    4:07 PM  GAD 7 : Generalized Anxiety Score  Nervous, Anxious, on Edge 2 2 2 1   Control/stop worrying 2 2 3  0  Worry too much - different things 2 2 3  0  Trouble relaxing 2 2 3 1   Restless 2 2 3  0  Easily annoyed or irritable 2 3 3 2   Afraid - awful might happen 0 2 1 0  Total GAD 7 Score 12 15 18 4   Anxiety Difficulty Somewhat difficult Very difficult Somewhat difficult Somewhat difficult      Review of Systems  Constitutional:  Positive for chills.  Gastrointestinal:  Positive for nausea and vomiting (1 last week).  Genitourinary:  Positive for dysuria, frequency, hematuria (once, now resolved) and urgency. Negative for flank pain.        Objective:    BP 94/63   Pulse 72   Temp 97.7 F (36.5 C) (Temporal)   Ht 5\' 7"  (1.702 m)   Wt 152 lb 3.2 oz (69 kg)   LMP 05/20/2017 Comment: tubal ligation  SpO2 98%   BMI 23.84 kg/m  Wt Readings from Last 3 Encounters:  07/15/23 152 lb 3.2 oz (69 kg)  05/09/23 163 lb (73.9 kg)  03/31/23 163 lb 8 oz (74.2 kg)  BP Readings from Last 3 Encounters:  07/15/23 94/63  05/09/23 124/88  03/31/23 99/69     Physical Exam Vitals and nursing note reviewed.  Constitutional:      General: She is not in acute distress.    Appearance: She is not ill-appearing, toxic-appearing or diaphoretic.  Cardiovascular:     Rate and Rhythm: Normal rate and regular rhythm.     Heart sounds: Normal heart sounds. No murmur heard. Pulmonary:     Effort: Pulmonary effort is normal. No respiratory distress.     Breath sounds: Normal breath sounds. No wheezing, rhonchi or rales.  Abdominal:     General: Bowel sounds are normal. There is no distension.     Palpations: Abdomen is soft.     Tenderness: There is no abdominal tenderness. There is no right CVA tenderness, left CVA tenderness, guarding or rebound.  Musculoskeletal:     Cervical back: Neck supple. No rigidity.  Skin:    General: Skin is warm and dry.  Neurological:      General: No focal deficit present.     Mental Status: She is alert and oriented to person, place, and time.  Psychiatric:        Mood and Affect: Mood normal.        Behavior: Behavior normal.     No results found for any visits on 07/15/23.      Assessment & Plan:   Jingyi Baugher" was seen today for stool color change and dysuria.  Diagnoses and all orders for this visit:  Acute cystitis without hematuria Bactrim as below for UTI pending culture.  -     Urinalysis, Routine w reflex microscopic -     Urine Culture -     sulfamethoxazole-trimethoprim (BACTRIM DS) 800-160 MG tablet; Take 1 tablet by mouth 2 (two) times daily for 7 days.  Pale stool 3x over last month. Also has intermittent lower abdominal pain, nausea and 1 epsisode of vomiting. Will check labs as below. Discussed may need to lower mounjaro or hold this for now if symptoms persist as this is likely side effects of this medication. Recent A1c was 6.3. -     CBC with Differential/Platelet -     CMP14+EGFR -     Acute Hep Panel & Hep B Surface Ab -     Lipase  Intermittent lower abdominal pain  S/P cholecystectomy  Type 2 diabetes mellitus with hyperglycemia, without long-term current use of insulin (HCC)  Long-term current use of injectable noninsulin antidiabetic medication  Situational depression Notify for new or worsening symptoms and will readdress treatment options.    Return if symptoms worsen or fail to improve.  The patient indicates understanding of these issues and agrees with the plan.  Gabriel Earing, FNP

## 2023-07-16 LAB — CBC WITH DIFFERENTIAL/PLATELET
Basophils Absolute: 0.1 10*3/uL (ref 0.0–0.2)
Basos: 1 %
EOS (ABSOLUTE): 0.3 10*3/uL (ref 0.0–0.4)
Eos: 3 %
Hematocrit: 41.9 % (ref 34.0–46.6)
Hemoglobin: 13.1 g/dL (ref 11.1–15.9)
Immature Grans (Abs): 0 10*3/uL (ref 0.0–0.1)
Immature Granulocytes: 0 %
Lymphocytes Absolute: 2.7 10*3/uL (ref 0.7–3.1)
Lymphs: 28 %
MCH: 26.4 pg — ABNORMAL LOW (ref 26.6–33.0)
MCHC: 31.3 g/dL — ABNORMAL LOW (ref 31.5–35.7)
MCV: 84 fL (ref 79–97)
Monocytes Absolute: 0.8 10*3/uL (ref 0.1–0.9)
Monocytes: 8 %
Neutrophils Absolute: 5.7 10*3/uL (ref 1.4–7.0)
Neutrophils: 60 %
Platelets: 405 10*3/uL (ref 150–450)
RBC: 4.97 x10E6/uL (ref 3.77–5.28)
RDW: 13.7 % (ref 11.7–15.4)
WBC: 9.6 10*3/uL (ref 3.4–10.8)

## 2023-07-16 LAB — CMP14+EGFR
ALT: 12 IU/L (ref 0–32)
AST: 15 IU/L (ref 0–40)
Albumin: 3.7 g/dL — ABNORMAL LOW (ref 3.8–4.9)
Alkaline Phosphatase: 118 IU/L (ref 44–121)
BUN/Creatinine Ratio: 7 — ABNORMAL LOW (ref 9–23)
BUN: 5 mg/dL — ABNORMAL LOW (ref 6–24)
Bilirubin Total: 0.3 mg/dL (ref 0.0–1.2)
CO2: 25 mmol/L (ref 20–29)
Calcium: 9.5 mg/dL (ref 8.7–10.2)
Chloride: 98 mmol/L (ref 96–106)
Creatinine, Ser: 0.69 mg/dL (ref 0.57–1.00)
Globulin, Total: 2.5 g/dL (ref 1.5–4.5)
Glucose: 122 mg/dL — ABNORMAL HIGH (ref 70–99)
Potassium: 3.7 mmol/L (ref 3.5–5.2)
Sodium: 138 mmol/L (ref 134–144)
Total Protein: 6.2 g/dL (ref 6.0–8.5)
eGFR: 104 mL/min/{1.73_m2} (ref >59)

## 2023-07-16 LAB — ACUTE HEP PANEL AND HEP B SURFACE AB: Hepatitis B Surf Ab Quant: 56.4 m[IU]/mL

## 2023-07-16 LAB — LIPASE: Lipase: 27 U/L (ref 14–72)

## 2023-07-18 ENCOUNTER — Other Ambulatory Visit: Payer: Self-pay | Admitting: *Deleted

## 2023-07-18 MED ORDER — TIRZEPATIDE 2.5 MG/0.5ML ~~LOC~~ SOAJ: 2.5000 mg | SUBCUTANEOUS | 3 refills | Status: DC

## 2023-07-20 LAB — URINE CULTURE

## 2023-07-21 ENCOUNTER — Encounter: Payer: Self-pay | Admitting: Family Medicine

## 2023-08-06 ENCOUNTER — Other Ambulatory Visit: Payer: Self-pay | Admitting: Family Medicine

## 2023-08-06 DIAGNOSIS — G43009 Migraine without aura, not intractable, without status migrainosus: Secondary | ICD-10-CM

## 2023-08-10 ENCOUNTER — Encounter: Payer: Self-pay | Admitting: Family Medicine

## 2023-08-10 ENCOUNTER — Ambulatory Visit: Payer: Medicaid Other | Admitting: Family Medicine

## 2023-08-10 VITALS — BP 104/69 | HR 94 | Temp 98.4°F | Ht 67.0 in | Wt 151.0 lb

## 2023-08-10 DIAGNOSIS — E785 Hyperlipidemia, unspecified: Secondary | ICD-10-CM

## 2023-08-10 DIAGNOSIS — Z7985 Long-term (current) use of injectable non-insulin antidiabetic drugs: Secondary | ICD-10-CM

## 2023-08-10 DIAGNOSIS — K219 Gastro-esophageal reflux disease without esophagitis: Secondary | ICD-10-CM | POA: Diagnosis not present

## 2023-08-10 DIAGNOSIS — G43009 Migraine without aura, not intractable, without status migrainosus: Secondary | ICD-10-CM

## 2023-08-10 DIAGNOSIS — E876 Hypokalemia: Secondary | ICD-10-CM | POA: Diagnosis not present

## 2023-08-10 DIAGNOSIS — E1165 Type 2 diabetes mellitus with hyperglycemia: Secondary | ICD-10-CM | POA: Diagnosis not present

## 2023-08-10 DIAGNOSIS — E1169 Type 2 diabetes mellitus with other specified complication: Secondary | ICD-10-CM

## 2023-08-10 MED ORDER — POTASSIUM CHLORIDE ER 10 MEQ PO TBCR
30.0000 meq | EXTENDED_RELEASE_TABLET | Freq: Every day | ORAL | 1 refills | Status: DC
Start: 2023-08-10 — End: 2023-11-08

## 2023-08-10 MED ORDER — PANTOPRAZOLE SODIUM 40 MG PO TBEC: 40.0000 mg | DELAYED_RELEASE_TABLET | Freq: Every day | ORAL | 3 refills | Status: DC

## 2023-08-10 NOTE — Progress Notes (Signed)
Established Patient Office Visit  Subjective   Patient ID: Alejandra Riley, female    DOB: 05-Feb-1971  Age: 53 y.o. MRN: [id number]  Chief Complaint  Patient presents with   Medical Management of Chronic Issues    HPI  T2DM Pt presents for initial evaluation of Type 2 diabetes mellitus. Patient denies foot ulcerations, increased appetite, nausea, paresthesia of the feet, polydipsia, polyuria, visual disturbances, vomiting, and weight loss.  Current diabetic medications include: mounjaro 2.5 mg. Having some mild nausea. No improvement with GERD with lower dosage.   Jardiance made her feel very fatigued. Metformin caused GI side effects.   Current monitoring regimen: none  2. HLD She started crestor and has been compliant. Denies side effects.   3. GERD Improving overall with PPI. Compliant with prilosec 40 mg. Has increased belching with sour burps all day. No heartburn. Denies dysphagia, vomiting, abdominal pain, cough, sore throat. Has also failed pepto, pepcid, nexium.   4. Migraine Nurtec is no longer helping much for her migraines. Having 2-4 migraines a month typically. HA is unilateral with sensitivity to light and nosie. She sometimes has nausea. Denies vomiting or aura. Imitrex causes arm weakness and significant drowsiness. She has also failed excedrin migraine, tylenol, advil, frova, rizatriptan.   Past Medical History:  Diagnosis Date   Anxiety    Diabetes mellitus without complication (HCC)    High cholesterol    Left breast mass 09/20/2017   Benign   Migraines    Prediabetes 05/21/2021    ROS As per HPI.    Objective:     BP 104/69   Pulse 94   Temp 98.4 F (36.9 C) (Temporal)   Ht 5\' 7"  (1.702 m)   Wt 151 lb (68.5 kg)   LMP 05/20/2017 Comment: tubal ligation  SpO2 99%   BMI 23.65 kg/m    Physical Exam Vitals and nursing note reviewed.  Constitutional:      General: She is not in acute distress.    Appearance: Normal appearance. She is not  ill-appearing, toxic-appearing or diaphoretic.  HENT:     Mouth/Throat:     Tonsils: No tonsillar exudate or tonsillar abscesses. 1+ on the right. 1+ on the left.  Neck:     Thyroid: No thyroid mass, thyromegaly or thyroid tenderness.  Cardiovascular:     Rate and Rhythm: Normal rate and regular rhythm.     Heart sounds: Normal heart sounds. No murmur heard. Pulmonary:     Effort: Pulmonary effort is normal. No respiratory distress.     Breath sounds: Normal breath sounds.  Musculoskeletal:     Cervical back: No edema, erythema or rigidity. Normal range of motion.     Right lower leg: No edema.     Left lower leg: No edema.  Skin:    General: Skin is warm.  Neurological:     General: No focal deficit present.     Mental Status: She is alert and oriented to person, place, and time.  Psychiatric:        Mood and Affect: Mood normal.        Behavior: Behavior normal.     No results found for any visits on 08/10/23.    The ASCVD Risk score (Arnett DK, et al., 2019) failed to calculate for the following reasons:   The valid total cholesterol range is 130 to 320 mg/dL    Assessment & Plan:   Alejandra Riley" was seen today for medical management of chronic issues.  Diagnoses  and all orders for this visit:  Type 2 diabetes mellitus with hyperglycemia, without long-term current use of insulin (HCC) A1c 5.6 today, at goal of <7. Medication changes today: continue mounjaro 2.5 mg. She is on an  ACE/ARB and statin.  -     Bayer DCA Hb A1c Waived  Gastroesophageal reflux disease without esophagitis Uncontrolled. Failed omeprazole and nexium. Will try protonix as below.. -     pantoprazole (PROTONIX) 40 MG tablet; Take 1 tablet (40 mg total) by mouth daily.  Hypokalemia -     potassium chloride (KLOR-CON) 10 MEQ tablet; Take 3 tablets (30 mEq total) by mouth daily.  Migraine without aura and without status migrainosus, not intractable Will try Qulipta 10 mg daily for episodic  migraine prevention. She has failed imitrex, rizatriptan, nurtec, frova.   Hyperlipidemia associated with type 2 diabetes mellitus (HCC) On statin.   Return in about 3 months (around 11/08/2023) for chronic follow up.   The patient indicates understanding of these issues and agrees with the plan.  Gabriel Earing, FNP

## 2023-08-11 ENCOUNTER — Encounter: Payer: Self-pay | Admitting: Family Medicine

## 2023-08-11 LAB — BAYER DCA HB A1C WAIVED: HB A1C (BAYER DCA - WAIVED): 5.6 % (ref 4.8–5.6)

## 2023-08-11 MED ORDER — QULIPTA 10 MG PO TABS
10.0000 mg | ORAL_TABLET | Freq: Every day | ORAL | 1 refills | Status: DC
Start: 2023-08-11 — End: 2023-11-08

## 2023-08-16 ENCOUNTER — Telehealth: Payer: Self-pay | Admitting: Pharmacist

## 2023-08-16 ENCOUNTER — Other Ambulatory Visit (HOSPITAL_COMMUNITY): Payer: Self-pay

## 2023-08-16 NOTE — Telephone Encounter (Signed)
Pharmacy Patient Advocate Encounter  Received notification from [employer] that Prior Authorization for Qulipta 10MG  tablets has been APPROVED from 07/27/2023 to 08/15/2024. Ran test claim, Copay is $0.00. This test claim was processed through [facility]- copay amounts may vary at other pharmacies due to pharmacy/plan contracts, or as the patient moves through the different stages of their insurance plan.   PA #/Case ID/Reference #: [id number]  Pharmacy notified.

## 2023-08-16 NOTE — Telephone Encounter (Signed)
Pharmacy Patient Advocate Encounter   Received notification from Patient Pharmacy that prior authorization for Qulipta 10MG  tablets is required/requested.   Insurance verification completed.   The patient is insured through [employer] .   Per test claim: PA required; PA submitted to above mentioned insurance via CoverMyMeds Key/confirmation #/EOC [id number] Status is pending

## 2023-08-16 NOTE — Telephone Encounter (Signed)
Pharmacy Patient Advocate Encounter   Received notification from Physician's Office that prior authorization for Mounjaro 2.5MG /0.5ML auto-injectors is required/requested.   Insurance verification completed.   The patient is insured through [employer] .   Per test claim: PA required; PA submitted to above mentioned insurance via CoverMyMeds Key/confirmation #/EOC [id number] Status is pending

## 2023-08-17 NOTE — Telephone Encounter (Signed)
Pharmacy Patient Advocate Encounter  Received notification from [employer] that Prior Authorization for [facility] 2.5MG /0.5ML auto-injectors has been APPROVED from 07/27/2023 to 08/15/2024   PA #/Case ID/Reference #: [id number]

## 2023-08-26 ENCOUNTER — Other Ambulatory Visit (HOSPITAL_COMMUNITY): Payer: Self-pay

## 2023-09-11 DIAGNOSIS — H5213 Myopia, bilateral: Secondary | ICD-10-CM | POA: Diagnosis not present

## 2023-10-14 ENCOUNTER — Other Ambulatory Visit: Payer: Self-pay | Admitting: Family Medicine

## 2023-10-14 DIAGNOSIS — K219 Gastro-esophageal reflux disease without esophagitis: Secondary | ICD-10-CM

## 2023-10-17 ENCOUNTER — Other Ambulatory Visit (HOSPITAL_COMMUNITY): Payer: Self-pay

## 2023-10-31 ENCOUNTER — Other Ambulatory Visit (HOSPITAL_COMMUNITY): Payer: Self-pay

## 2023-11-09 ENCOUNTER — Encounter: Payer: Self-pay | Admitting: Family Medicine

## 2023-11-09 ENCOUNTER — Ambulatory Visit: Payer: Medicaid Other | Admitting: Family Medicine

## 2023-11-09 ENCOUNTER — Telehealth: Payer: Self-pay

## 2023-11-09 ENCOUNTER — Other Ambulatory Visit: Payer: Self-pay | Admitting: Family Medicine

## 2023-11-09 VITALS — BP 115/83 | HR 96 | Temp 97.7°F | Ht 67.0 in | Wt 150.2 lb

## 2023-11-09 DIAGNOSIS — E1169 Type 2 diabetes mellitus with other specified complication: Secondary | ICD-10-CM

## 2023-11-09 DIAGNOSIS — E785 Hyperlipidemia, unspecified: Secondary | ICD-10-CM

## 2023-11-09 DIAGNOSIS — K219 Gastro-esophageal reflux disease without esophagitis: Secondary | ICD-10-CM

## 2023-11-09 DIAGNOSIS — G43009 Migraine without aura, not intractable, without status migrainosus: Secondary | ICD-10-CM

## 2023-11-09 DIAGNOSIS — Z7985 Long-term (current) use of injectable non-insulin antidiabetic drugs: Secondary | ICD-10-CM

## 2023-11-09 DIAGNOSIS — E1165 Type 2 diabetes mellitus with hyperglycemia: Secondary | ICD-10-CM | POA: Diagnosis not present

## 2023-11-09 DIAGNOSIS — M546 Pain in thoracic spine: Secondary | ICD-10-CM

## 2023-11-09 DIAGNOSIS — E876 Hypokalemia: Secondary | ICD-10-CM

## 2023-11-09 DIAGNOSIS — G8929 Other chronic pain: Secondary | ICD-10-CM

## 2023-11-09 LAB — BAYER DCA HB A1C WAIVED: HB A1C (BAYER DCA - WAIVED): 5.5 % (ref 4.8–5.6)

## 2023-11-09 MED ORDER — QULIPTA 10 MG PO TABS
10.0000 mg | ORAL_TABLET | Freq: Every day | ORAL | 3 refills | Status: AC
Start: 2023-11-09 — End: ?

## 2023-11-09 MED ORDER — ROSUVASTATIN CALCIUM 10 MG PO TABS
10.0000 mg | ORAL_TABLET | Freq: Every day | ORAL | 3 refills | Status: AC
Start: 2023-11-09 — End: ?

## 2023-11-09 MED ORDER — METHOCARBAMOL 500 MG PO TABS
500.0000 mg | ORAL_TABLET | Freq: Three times a day (TID) | ORAL | 3 refills | Status: AC | PRN
Start: 2023-11-09 — End: ?

## 2023-11-09 MED ORDER — TIRZEPATIDE 2.5 MG/0.5ML ~~LOC~~ SOAJ
2.5000 mg | SUBCUTANEOUS | 3 refills | Status: DC
Start: 2023-11-09 — End: 2023-11-16

## 2023-11-09 MED ORDER — PANTOPRAZOLE SODIUM 40 MG PO TBEC
40.0000 mg | DELAYED_RELEASE_TABLET | Freq: Every day | ORAL | 3 refills | Status: DC
Start: 2023-11-09 — End: 2024-03-28

## 2023-11-09 MED ORDER — LISINOPRIL 2.5 MG PO TABS
2.5000 mg | ORAL_TABLET | Freq: Every day | ORAL | 3 refills | Status: AC
Start: 2023-11-09 — End: ?

## 2023-11-09 MED ORDER — POTASSIUM CHLORIDE ER 10 MEQ PO TBCR
30.0000 meq | EXTENDED_RELEASE_TABLET | Freq: Every day | ORAL | 1 refills | Status: DC
Start: 2023-11-09 — End: 2024-05-16

## 2023-11-09 NOTE — Telephone Encounter (Signed)
 Pharmacy Patient Advocate Encounter   Received notification from CoverMyMeds that prior authorization for  Qulipta 10MG  tablets is required/requested.   Insurance verification completed.   The patient is insured through [facility] .   Per test claim: PA required; PA submitted to above mentioned insurance via CoverMyMeds Key/confirmation #/EOC [id number] Status is pending

## 2023-11-09 NOTE — Progress Notes (Addendum)
 Established Patient Office Visit  Subjective   Patient ID: Alejandra Riley, female    DOB: 21-Sep-1970  Age: 53 y.o. MRN: [id number]  Chief Complaint  Patient presents with   Medical Management of Chronic Issues    HPI  T2DM Pt presents for initial evaluation of Type 2 diabetes mellitus. Patient denies foot ulcerations, increased appetite, nausea, paresthesia of the feet, polydipsia, polyuria, visual disturbances, vomiting, and weight loss.  Current diabetic medications include: mounjaro  2.5 mg. Occasional nausea.   Jardiance  made her feel very fatigued. Metformin  caused GI side effects.   Current monitoring regimen: none  2. HLD She started crestor  and has been compliant. Denies side effects.   3. GERD Compliant with medications - Yes Current medications - protonix  40 Voice change - No Hemoptysis - No Dysphagia or dyspepsia - No Water brash - No Red Flags (weight loss, hematochezia, melena, weight loss, early satiety, fevers, odynophagia, or persistent vomiting) - No  4. Migraine Qulipta  doing well with 1 migraine day a month typically. Prior to treatment with Qulipta , she was getting 4-12 migraines monthly. Previously tried and failed nurtec, excedrin migraine, tylenol , advil, frova , rizatriptan .    5. Back pain Increased back pain with new job. Hurting mid back across both side. Pain is intermittent. Worse after increased activity. Achy pain. Tylenol , advil, aleve with minimal improvement. Denies changes in bowel or bladder control, leg pain, numbness, tingling, weakness, saddle anesthesia.   Past Medical History:  Diagnosis Date   Anxiety    Diabetes mellitus without complication (HCC)    High cholesterol    Left breast mass 09/20/2017   Benign   Migraines    Prediabetes 05/21/2021    ROS As per HPI.    Objective:     BP 115/83   Pulse 96   Temp 97.7 F (36.5 C) (Temporal)   Ht 5\' 7"  (1.702 m)   Wt 150 lb 3.2 oz (68.1 kg)   LMP 05/20/2017 Comment:  tubal ligation  BMI 23.52 kg/m   Wt Readings from Last 3 Encounters:  11/09/23 150 lb 3.2 oz (68.1 kg)  08/10/23 151 lb (68.5 kg)  07/15/23 152 lb 3.2 oz (69 kg)     Physical Exam Vitals and nursing note reviewed.  Constitutional:      General: She is not in acute distress.    Appearance: Normal appearance. She is not ill-appearing, toxic-appearing or diaphoretic.  HENT:     Mouth/Throat:     Tonsils: No tonsillar exudate or tonsillar abscesses. 1+ on the right. 1+ on the left.  Neck:     Thyroid : No thyroid  mass, thyromegaly or thyroid  tenderness.  Cardiovascular:     Rate and Rhythm: Normal rate and regular rhythm.     Heart sounds: Normal heart sounds. No murmur heard. Pulmonary:     Effort: Pulmonary effort is normal. No respiratory distress.     Breath sounds: Normal breath sounds.  Musculoskeletal:     Cervical back: No edema, erythema or rigidity. Normal range of motion.     Right lower leg: No edema.     Left lower leg: No edema.  Skin:    General: Skin is warm.  Neurological:     General: No focal deficit present.     Mental Status: She is alert and oriented to person, place, and time.  Psychiatric:        Mood and Affect: Mood normal.        Behavior: Behavior normal.  No results found for any visits on 11/09/23.    The ASCVD Risk score (Arnett DK, et al., 2019) failed to calculate for the following reasons:   The valid total cholesterol range is 130 to 320 mg/dL    Assessment & Plan:   Alejandra Riley" was seen today for medical management of chronic issues.  Diagnoses and all orders for this visit:  Type 2 diabetes mellitus with hyperglycemia, without long-term current use of insulin  (HCC) A1c is 5.5. At goal of <7. On ACE and statin.  -     Bayer DCA Hb A1c Waived -     lisinopril  (ZESTRIL ) 2.5 MG tablet; Take 1 tablet (2.5 mg total) by mouth daily. -     tirzepatide  (MOUNJARO ) 2.5 MG/0.5ML Pen; Inject 2.5 mg into the skin once a  week.  Hyperlipidemia associated with type 2 diabetes mellitus (HCC) On statin. Last LDL 42. -     rosuvastatin  (CRESTOR ) 10 MG tablet; Take 1 tablet (10 mg total) by mouth daily.  Gastroesophageal reflux disease without esophagitis Well controlled on current regimen.  -     pantoprazole  (PROTONIX ) 40 MG tablet; Take 1 tablet (40 mg total) by mouth daily.  Migraine without aura and without status migrainosus, not intractable Well controlled on current regimen.  -     Atogepant  (QULIPTA ) 10 MG TABS; Take 10 mg by mouth daily.  Hypokalemia -     potassium chloride  (KLOR-CON ) 10 MEQ tablet; Take 3 tablets (30 mEq total) by mouth daily.  Chronic bilateral thoracic back pain -     methocarbamol  (ROBAXIN ) 500 MG tablet; Take 1 tablet (500 mg total) by mouth every 8 (eight) hours as needed for muscle spasms.   Return in about 6 months (around 05/10/2024) for CPE with fasting labs.   The patient indicates understanding of these issues and agrees with the plan.  Albertha Huger, FNP

## 2023-11-10 ENCOUNTER — Other Ambulatory Visit (HOSPITAL_COMMUNITY): Payer: Self-pay

## 2023-11-11 ENCOUNTER — Other Ambulatory Visit (HOSPITAL_COMMUNITY): Payer: Self-pay

## 2023-11-14 NOTE — Telephone Encounter (Signed)
 Pharmacy Patient Advocate Encounter  Received notification from [facility] that Prior Authorization for Qulipta  10MG  tablets has been DENIED.  Full denial letter will be uploaded to the media tab. See denial reason below.   PA #/Case ID/Reference #: [id number]

## 2023-11-15 ENCOUNTER — Telehealth: Payer: Self-pay | Admitting: Family Medicine

## 2023-11-15 ENCOUNTER — Other Ambulatory Visit (HOSPITAL_COMMUNITY): Payer: Self-pay

## 2023-11-15 ENCOUNTER — Telehealth: Payer: Self-pay

## 2023-11-15 NOTE — Telephone Encounter (Signed)
 Attempted to call patient regarding her medications-no answer- left message office will be notified of her call.  No medication names in message- assume possible : tirzepatide  (MOUNJARO ) 2.5 MG/0.5ML Pen and Atogepant  (QULIPTA ) 10 MG TABS since these are most recent in chart and require PA. Please follow up with patient if she has alternative choices    Copied from CRM [id number]. Topic: Clinical - Prescription Issue >> Nov 15, 2023  1:40 PM Alejandra Riley wrote: Her insurance is denying her medication for migraines and A!C medicine and wants to know what can be done.  [facility] - [place], [place] - [address] [place]  [address] Phone: [phone] Fax: [phone] Hours: Not open 24 hours

## 2023-11-15 NOTE — Telephone Encounter (Signed)
 Pharmacy Patient Advocate Encounter   Received notification from Pt Calls Messages that prior authorization for Mounjaro  2.5MG /0.5ML auto-injectors is required/requested.   Insurance verification completed.   The patient is insured through [facility] .   Per test claim: PA required; PA submitted to above mentioned insurance via CoverMyMeds Key/confirmation #/EOC [id number] Status is pending

## 2023-11-15 NOTE — Telephone Encounter (Signed)
 One PA pending and the other medication has already been sent to [facility].

## 2023-11-16 MED ORDER — TRULICITY 0.75 MG/0.5ML ~~LOC~~ SOAJ: 0.7500 mg | SUBCUTANEOUS | 0 refills | Status: AC

## 2023-11-16 NOTE — Telephone Encounter (Signed)
 Pharmacy Patient Advocate Encounter  Received notification from [facility] that Prior Authorization for MOUNJARO  2.5MG /0.5ML has been DENIED.  See denial reason below. No denial letter attached in CMM. Will attach denial letter to Media tab once received.   PA #/Case ID/Reference #: [id number]

## 2023-11-17 ENCOUNTER — Other Ambulatory Visit (HOSPITAL_COMMUNITY): Payer: Self-pay

## 2023-11-17 ENCOUNTER — Telehealth: Payer: Self-pay

## 2023-11-17 ENCOUNTER — Telehealth: Payer: Self-pay | Admitting: Family Medicine

## 2023-11-17 NOTE — Telephone Encounter (Signed)
 Pharmacy Patient Advocate Encounter  Received notification from [facility] that Prior Authorization for Trulicity 0.75MG /0.5ML auto-injectors has been APPROVED from 11/17/2023 to 11/16/2024. Ran test claim, Copay is $4.00. This test claim was processed through [facility]- copay amounts may vary at other pharmacies due to pharmacy/plan contracts, or as the patient moves through the different stages of their insurance plan.   PA #/Case ID/Reference #: [id number]

## 2023-11-17 NOTE — Telephone Encounter (Signed)
 Pharmacy Patient Advocate Encounter   Received notification from Onbase that prior authorization for Trulicity 0.75MG /0.5ML auto-injectors is required/requested.   Insurance verification completed.   The patient is insured through [facility] .   Per test claim: PA required; PA submitted to above mentioned insurance via CoverMyMeds Key/confirmation #/EOC [id number] Status is pending

## 2023-11-17 NOTE — Telephone Encounter (Signed)
 Copied from CRM [id number]. Topic: Clinical - Prescription Issue >> Nov 17, 2023  3:40 PM Alysia Jumbo S wrote: Reason for CRM: Patient states that her insurance is no longer covering medication, Atogepant  (QULIPTA ) 10 MG TABS. She is wanting to know if there are any alternative medications that are covered by her insurance. Callback # [phone], ok to leave message.

## 2023-11-17 NOTE — Telephone Encounter (Signed)
 Copied from CRM [id number]. Topic: Clinical - Prescription Issue >> Nov 17, 2023  3:48 PM Alejandra Riley wrote: Reason for CRM: Patient needs prior authorizations for her meds that just got sent over to the pharmacy. She wants MOUNJARO  not Dulaglutide (TRULICITY) 0.75 MG/0.5ML SOAJ if at all possible, Atogepant  (QULIPTA ) 10 MG TABS.

## 2023-11-18 NOTE — Telephone Encounter (Signed)
Pt aware of samples  °

## 2023-11-18 NOTE — Telephone Encounter (Signed)
 Pt is aware she must try Trulicity due to her insurance and agrees. Pt also aware Jyl Or will be back on Monday and can address Qulipta .

## 2023-11-18 NOTE — Telephone Encounter (Signed)
 Pt request mounjaro  instead. Awaiting provider response. LS

## 2023-11-21 MED ORDER — AIMOVIG 70 MG/ML ~~LOC~~ SOAJ
1.0000 mL | SUBCUTANEOUS | 3 refills | Status: DC
Start: 2023-11-21 — End: 2023-12-22

## 2023-11-21 NOTE — Telephone Encounter (Signed)
 Insurance will require her to try and fail 2 of the following before approving Quilipta: aimovig, ajovy, and emgalitiy. I've sent in Aimovig for her to inject monthly.

## 2023-11-21 NOTE — Telephone Encounter (Signed)
 Pt aware and voiced understanding

## 2023-11-21 NOTE — Telephone Encounter (Signed)
 See telephone encounter. Aimovig Rx sent.

## 2023-11-21 NOTE — Addendum Note (Signed)
 Addended by: Albertha Huger on: 11/21/2023 11:08 AM   Modules accepted: Orders

## 2023-11-21 NOTE — Telephone Encounter (Signed)
 See PA telephone encounter. Aimovig Rx sent in.

## 2023-11-23 ENCOUNTER — Other Ambulatory Visit (HOSPITAL_COMMUNITY): Payer: Self-pay

## 2023-11-23 ENCOUNTER — Telehealth: Payer: Self-pay

## 2023-11-23 NOTE — Telephone Encounter (Signed)
 Pharmacy Patient Advocate Encounter   Received notification from [employer]My[employer]eds that prior authorization for Aimovig 70MG /ML auto-injectors is required/requested.   Insurance verification completed.   The patient is insured through [facility] .   Per test claim: PA required; PA submitted to above mentioned insurance via CoverMyMeds Key/confirmation #/EOC  [facility] Status is pending

## 2023-11-23 NOTE — Telephone Encounter (Signed)
   Hi I scanned this in onbase as well and sent to you

## 2023-12-12 ENCOUNTER — Telehealth: Payer: Self-pay

## 2023-12-12 ENCOUNTER — Other Ambulatory Visit (HOSPITAL_COMMUNITY): Payer: Self-pay

## 2023-12-12 NOTE — Telephone Encounter (Signed)
 Copied from CRM [id number]. Topic: Clinical - Medication Prior Auth >> Dec 12, 2023  3:51 PM Brynn Caras wrote: Reason for CRM: The patient insurance has changed effective 11/24/2023, she has uploaded a copy of the front/back of her new insurance card through her chart. The patient states her pharmacy [facility] is requesting a prior authorization for her diabetic medication - lisinopril  (ZESTRIL ) 2.5 MG tablet. Updated patient's insurance as requested.

## 2023-12-12 NOTE — Telephone Encounter (Signed)
 Pharmacy Patient Advocate Encounter   Received notification from Pt Calls Messages that prior authorization for LISINOPRIL  2.5MG  is required/requested.   Insurance verification completed.   The patient is insured through [employer] .   Per test claim: The current 90 day co-pay is, $4.29.  No PA needed at this time. This test claim was processed through [facility]- copay amounts may vary at other pharmacies due to pharmacy/plan contracts, or as the patient moves through the different stages of their insurance plan.

## 2023-12-12 NOTE — Telephone Encounter (Signed)
 Per test claim: The current 90 day co-pay for Lisinopril  is, $4.29.  No PA needed at this time.

## 2023-12-20 ENCOUNTER — Telehealth: Payer: Self-pay

## 2023-12-20 ENCOUNTER — Other Ambulatory Visit: Payer: Self-pay | Admitting: Family Medicine

## 2023-12-20 ENCOUNTER — Other Ambulatory Visit (HOSPITAL_COMMUNITY): Payer: Self-pay

## 2023-12-20 NOTE — Telephone Encounter (Signed)
 Please review and advise.

## 2023-12-20 NOTE — Telephone Encounter (Signed)
 Pharmacy Patient Advocate Encounter   Received notification from CoverMy[employer]eds that prior authorization for Qulipta  10MG  tablets is required/requested.   Insurance verification completed.   The patient is insured through [employer] .   Per test claim:  PA required; PA started via [employer]. EOC [id number] . Please see clinical question(s) below that I am not finding the answer to in her chart and advise.

## 2023-12-22 ENCOUNTER — Encounter: Payer: Self-pay | Admitting: Family Medicine

## 2023-12-22 NOTE — Telephone Encounter (Signed)
 Copied from CRM [id number]. Topic: Clinical - Medication Prior Auth >> Dec 22, 2023  3:43 PM Star East wrote: Reason for CRM: checking to see if [facility] PA fax showed up for Atogepant  (QULIPTA ) 10 MG TABS- they need a form signed and sent back- [phone]

## 2023-12-26 ENCOUNTER — Telehealth: Payer: Self-pay

## 2023-12-26 NOTE — Telephone Encounter (Signed)
 Copied from CRM [id number]. Topic: Clinical - Medication Prior Auth >> Dec 26, 2023 10:40 AM Star East wrote: Reason for CRM: Insurance information is up to date. Any further questions [phone]

## 2023-12-26 NOTE — Telephone Encounter (Signed)
 Pt has insurance cards scanned in and needs PA on Quilipta which see other encounter as the PA team is working on this currently.

## 2023-12-27 ENCOUNTER — Other Ambulatory Visit (HOSPITAL_COMMUNITY): Payer: Self-pay

## 2023-12-28 ENCOUNTER — Other Ambulatory Visit (HOSPITAL_COMMUNITY): Payer: Self-pay

## 2023-12-28 ENCOUNTER — Ambulatory Visit: Admitting: Family Medicine

## 2024-01-02 NOTE — Telephone Encounter (Signed)
 [place] from the [employer] calling about the pa for Atogepant  (QULIPTA ) 10 MG TABS. She provided the PA ph# [phone] Fax# [phone].

## 2024-01-03 ENCOUNTER — Encounter: Payer: Self-pay | Admitting: Family Medicine

## 2024-01-04 ENCOUNTER — Other Ambulatory Visit (HOSPITAL_COMMUNITY): Payer: Self-pay

## 2024-01-04 NOTE — Telephone Encounter (Signed)
 PA submtted via [email or web address]. EOC: [id number]

## 2024-01-06 ENCOUNTER — Other Ambulatory Visit (HOSPITAL_COMMUNITY): Payer: Self-pay

## 2024-01-18 ENCOUNTER — Other Ambulatory Visit (HOSPITAL_COMMUNITY): Payer: Self-pay

## 2024-01-23 ENCOUNTER — Other Ambulatory Visit (HOSPITAL_COMMUNITY): Payer: Self-pay

## 2024-01-23 ENCOUNTER — Other Ambulatory Visit: Payer: Self-pay | Admitting: Family Medicine

## 2024-01-23 DIAGNOSIS — G43009 Migraine without aura, not intractable, without status migrainosus: Secondary | ICD-10-CM

## 2024-01-23 NOTE — Telephone Encounter (Signed)
 Copied from CRM [id number]. Topic: Clinical - Medication Refill >> Jan 23, 2024 10:24 AM Travis F wrote: Medication: Atogepant  (QULIPTA ) 10 MG TABS [[id number]]  Has the patient contacted their pharmacy? Yes  (Agent: If yes, when and what did the pharmacy advise?) Contact Office   This is the patient's preferred pharmacy:  [facility] - [place], [place] - [address] [place] [address] Phone: [phone] Fax: [phone]  Is this the correct pharmacy for this prescription? Yes  Has the prescription been filled recently? Yes  Is the patient out of the medication? Yes  Has the patient been seen for an appointment in the last year OR does the patient have an upcoming appointment? Yes  Can we respond through MyChart? Yes  Agent: Please be advised that Rx refills may take up to 3 business days. We ask that you follow-up with your pharmacy.

## 2024-02-02 ENCOUNTER — Ambulatory Visit: Payer: Self-pay | Admitting: Family Medicine

## 2024-03-01 ENCOUNTER — Other Ambulatory Visit (HOSPITAL_COMMUNITY): Payer: Self-pay | Admitting: Family Medicine

## 2024-03-01 DIAGNOSIS — Z1231 Encounter for screening mammogram for malignant neoplasm of breast: Secondary | ICD-10-CM

## 2024-03-05 ENCOUNTER — Ambulatory Visit (INDEPENDENT_AMBULATORY_CARE_PROVIDER_SITE_OTHER)

## 2024-03-05 ENCOUNTER — Ambulatory Visit: Admitting: Family

## 2024-03-05 ENCOUNTER — Ambulatory Visit: Payer: Self-pay

## 2024-03-05 VITALS — BP 115/79 | HR 73 | Temp 97.6°F | Ht 66.0 in | Wt 148.0 lb

## 2024-03-05 DIAGNOSIS — W19XXXA Unspecified fall, initial encounter: Secondary | ICD-10-CM | POA: Diagnosis not present

## 2024-03-05 DIAGNOSIS — M25512 Pain in left shoulder: Secondary | ICD-10-CM | POA: Diagnosis not present

## 2024-03-05 MED ORDER — DICLOFENAC SODIUM 75 MG PO TBEC: 75.0000 mg | DELAYED_RELEASE_TABLET | Freq: Two times a day (BID) | ORAL | 2 refills | Status: AC

## 2024-03-05 NOTE — Progress Notes (Signed)
 Subjective:    Patient ID: Alejandra Riley, female    DOB: 04-24-1971, 53 y.o.   MRN: [id number]  Chief Complaint  Patient presents with   Arm Injury    Left arm fall 1 mth ago hurt ever since    Pt presents to the office today with left shoulder pain that started a month ago after a fall.   Pt reports she walking and tripped and fell. She extended her hand out and caught herself on her car and landed on her knees.   Reports she has aching pain of 9 out 10 with abduction. Pain with internal rotation.  Arm Injury  The incident occurred more than 1 week ago. The injury mechanism was a fall. The pain is present in the left shoulder. The quality of the pain is described as aching. The pain is at a severity of 9/10. The pain is moderate. The pain has been Constant since the incident. Pertinent negatives include no numbness or tingling. Nothing aggravates the symptoms. She has tried rest and acetaminophen  for the symptoms. The treatment provided mild relief.      Review of Systems  Neurological:  Negative for tingling and numbness.  All other systems reviewed and are negative.   Social History   Socioeconomic History   Marital status: Significant Other    Spouse name: Carlin   Number of children: 3   Years of education: Not on file   Highest education level: 10th grade  Occupational History   Not on file  Tobacco Use   Smoking status: Every Day    Current packs/day: 1.50    Average packs/day: 1.5 packs/day for 13.6 years (20.4 ttl pk-yrs)    Types: Cigarettes    Start date: 2012   Smokeless tobacco: Never  Vaping Use   Vaping status: Never Used  Substance and Sexual Activity   Alcohol use: Not Currently   Drug use: Never   Sexual activity: Not on file  Other Topics Concern   Not on file  Social History Narrative   Lives at home with fiance and youngest son    Social Drivers of Corporate investment banker Strain: Medium Risk (03/05/2024)   Overall Financial Resource  Strain (CARDIA)    Difficulty of Paying Living Expenses: Somewhat hard  Food Insecurity: Food Insecurity Present (03/05/2024)   Hunger Vital Sign    Worried About Running Out of Food in the Last Year: Never true    Ran Out of Food in the Last Year: Sometimes true  Transportation Needs: No Transportation Needs (03/05/2024)   PRAPARE - Administrator, Civil Service (Medical): No    Lack of Transportation (Non-Medical): No  Physical Activity: Insufficiently Active (03/05/2024)   Exercise Vital Sign    Days of Exercise per Week: 3 days    Minutes of Exercise per Session: 20 min  Stress: Stress Concern Present (03/05/2024)   [employer] of Occupational Health - Occupational Stress Questionnaire    Feeling of Stress: Very much  Social Connections: Socially Isolated (03/05/2024)   Social Connection and Isolation Panel    Frequency of Communication with Friends and Family: Once a week    Frequency of Social Gatherings with Friends and Family: Once a week    Attends Religious Services: Never    Database administrator or Organizations: No    Attends Engineer, structural: Not on file    Marital Status: Living with partner   Family History  Problem Relation  Age of Onset   Vision loss Mother    Hypertension Mother    Depression Mother    Stroke Mother    Stomach cancer Father    Prostate cancer Father        Cause of death   Heart disease Maternal Grandmother    Hyperlipidemia Maternal Grandmother    Hypertension Maternal Grandmother    Diabetes Maternal Grandmother    Thyroid  disease Maternal Grandmother    Breast cancer Maternal Grandmother    Other Son        club foot        Objective:   Physical Exam Vitals reviewed.  Constitutional:      General: She is not in acute distress.    Appearance: She is well-developed.  HENT:     Head: Normocephalic and atraumatic.  Eyes:     Pupils: Pupils are equal, round, and reactive to light.  Neck:     Thyroid :  No thyromegaly.  Cardiovascular:     Rate and Rhythm: Normal rate and regular rhythm.     Heart sounds: Normal heart sounds. No murmur heard. Pulmonary:     Effort: Pulmonary effort is normal. No respiratory distress.     Breath sounds: Normal breath sounds. No wheezing.  Abdominal:     General: Bowel sounds are normal. There is no distension.     Palpations: Abdomen is soft.     Tenderness: There is no abdominal tenderness.  Musculoskeletal:        General: No tenderness.     Cervical back: Normal range of motion and neck supple.     Comments: Pain in abduction if left shoulder and internal rotation  Skin:    General: Skin is warm and dry.  Neurological:     Mental Status: She is alert and oriented to person, place, and time.     Cranial Nerves: No cranial nerve deficit.     Deep Tendon Reflexes: Reflexes are normal and symmetric.  Psychiatric:        Behavior: Behavior normal.        Thought Content: Thought content normal.        Judgment: Judgment normal.       BP 115/79   Pulse 73   Temp 97.6 F (36.4 C)   Ht 5' 6 (1.676 m)   Wt 148 lb (67.1 kg)   LMP 05/20/2017 Comment: tubal ligation  BMI 23.89 kg/m      Assessment & Plan:  Nawaal Alling comes in today with chief complaint of Arm Injury (Left arm fall 1 mth ago hurt ever since )   Diagnosis and orders addressed:  1. Acute pain of left shoulder (Primary) - DG Shoulder Left; Future - diclofenac  (VOLTAREN ) 75 MG EC tablet; Take 1 tablet (75 mg total) by mouth 2 (two) times daily.  Dispense: 60 tablet; Refill: 2 - Ambulatory referral to Physical Therapy  2. Fall, initial encounter   Start diclofenac  BID with food, no other NSAID's  Referral to Physical Therapy pending  Continue current medications  If pain continues may need CT to rule out tear.   No follow-ups on file.    Bari Learn, FNP

## 2024-03-05 NOTE — Telephone Encounter (Signed)
 FYI Only or Action Required?: Action required by provider: request for appointment.  Patient was last seen in primary care on 11/09/2023 by Joesph Annabella HERO, FNP.  Called Nurse Triage reporting Arm Pain.  Symptoms began about a month ago.  Interventions attempted: OTC medications: icy hot, tylenol , biofreeze.  Symptoms are: gradually worsening.  Triage Disposition: See PCP When Office is Open (Within 3 Days)  Patient/caregiver understands and will follow disposition?: YesCopied from CRM #[id number]. Topic: Clinical - Red Word Triage >> Mar 05, 2024  8:05 AM Zane F wrote: [facility] that prompted transfer to Nurse Triage:   Left arm in pain after it braced her fall a month ago; limited mobility   Pain Location: Starts at shoulder and goes to her elbow  Sometimes it goes from shoulder to her wrist but that is rare  Care: Icy hot; tylenol  doesn't help but she has taken that Reason for Disposition  [1] MODERATE pain (e.g., interferes with normal activities) AND [2] present > 3 days  Answer Assessment - Initial Assessment Questions Pt fell around a month ago and caught self with left arm against car. Every since it has been hurting but seems to be getting worse. Pt has hard time lifting /lowering arm and sleeping. Icy hot, biofreeze,  and tylenol  haven't helped for pain.   1. ONSET: When did the pain start?     Month ago 2. LOCATION: Where is the pain located?     Left arm 3. PAIN: How bad is the pain? (Scale 0-10; or none, mild, moderate, severe)     8 4. WORK OR EXERCISE: Has there been any recent work or exercise that involved this part of the body?     na 5. CAUSE: What do you think is causing the arm pain?     Fall  6. OTHER SYMPTOMS: Do you have any other symptoms? (e.g., neck pain, swelling, rash, fever, numbness, weakness)     Numbness is brief and random.  Protocols used: Arm Pain-A-AH

## 2024-03-05 NOTE — Patient Instructions (Signed)
 Rotator Cuff Tear: What to Know  A rotator cuff tear is a partial or full tear of the tissue called tendons that connect muscle to bone in the rotator cuff. The rotator cuff is a group of muscles and tendons that surround the shoulder joint. They keep the upper arm bone, called the humerus, in the shoulder socket. A tear can be acute, which means it happens all of a sudden. It can also be chronic, which means it forms slowly over time. What are the causes? Acute tears can happen if: You fall. They may happen if you stretch your arm to brace your fall. You lift very heavy objects with a jerking motion. Chronic tears can happen if you use your shoulder too much. This may happen from: Sports. Physical work. Doing things that make you move your arm over your head a lot. What increases the risk? You're more likely to get a rotator cuff tear if: You play sports. You do work that uses your shoulder a lot. You smoke. You're older or have arthritis. What are the signs or symptoms? If you have an acute tear, you may: Feel a sudden tearing. Have very bad pain that goes from your shoulder down your arm. If you have a chronic tear, you may have: Weakness in your shoulder. Less movement in your shoulder. Pain that gets worse at night. Both types may have: Pain that spreads from your shoulder to your upper arm. Swelling and tenderness in front of your shoulder. Trouble moving your arm, such as when: You reach or lift your arm above your head. You lower it from above your head. You raise it out to the side. You put it behind your back. How is this diagnosed? Your health care provider will check your medical history and do an exam. You may also need tests, such as: X-rays. An MRI. An ultrasound. A CT or MR arthrogram. During this test, a contrast dye is put in your shoulder, and then pictures are taken. How is this treated? Treatment depends on how bad the tear is. If the tear is mild, you  may need to: Rest your shoulder. This may be done with a sling or a device called an immobilizer that will keep your arm and shoulder still. You may also need to avoid lifting your arm. Ice the shoulder. Take medicines to help with pain and swelling. Do exercises to help you move and make your shoulder strong. Do physical therapy. For worse tears, treatment may include: Steroid shots. Surgery. Follow these instructions at home: Managing pain, stiffness, and swelling     Use ice or an ice pack as told. If you have a sling that you can take off, remove it only as told. Place a towel between your skin and the ice. Leave the ice on for 20 minutes, 2-3 times a day. Move your fingers often to reduce stiffness and swelling. Raise your arm above the level of your heart while you're sitting or lying down. Find a comfortable way to sleep. You may want to try to sleep sitting upright. Once the swelling has gone down, you may be told to use heat to relax your muscles. Use the heat source that your provider recommends, such as a moist heat pack or a heating pad. Do this as often as told. Place a towel between your skin and the heat source. Leave the heat on for 20-30 minutes. If your skin turns red, take off the ice or heat right away to prevent  skin damage. The risk of damage is higher if you can't feel pain, heat, or cold. If you have a sling or immobilizer: Wear the sling or immobilizer as told. Take it off only if your provider says you can. Check the skin around it every day. Tell your provider if you see problems. Loosen the sling or immobilizer if your fingers tingle, are numb, or turn cold and blue. Keep the sling or immobilizer clean. If it's not waterproof: Do not let it get wet. Cover it when you take a bath or shower. Use a cover that doesn't let any water in. Activity Rest your shoulder as told. Exercise as told. Ask when it's safe to drive if you have a sling or immobilizer. Ask  what things are safe for you to do at home. Ask when you can go back to work or school. General instructions Take your medicines only as told. Do not smoke, vape, or use nicotine or tobacco. Keep all follow-up visits. Your provider will check how your shoulder is healing. Contact a health care provider if: Your pain gets worse. You have new pain in your arm, hands, or fingers. Your pain doesn't get better with medicine. Get help right away if: Your arm, hand, or fingers are: Numb. Tingling. Swollen. Painful. White or blue. Your hand or fingers on your injured arm are colder than your other hand. This information is not intended to replace advice given to you by your health care provider. Make sure you discuss any questions you have with your health care provider. Document Revised: 06/14/2023 Document Reviewed: 06/14/2023 Elsevier Patient Education  2025 [employer].

## 2024-03-05 NOTE — Telephone Encounter (Signed)
 Appt today with [facility]

## 2024-03-08 ENCOUNTER — Other Ambulatory Visit: Payer: Self-pay | Admitting: Family Medicine

## 2024-03-08 DIAGNOSIS — K219 Gastro-esophageal reflux disease without esophagitis: Secondary | ICD-10-CM

## 2024-03-12 ENCOUNTER — Encounter (HOSPITAL_COMMUNITY)

## 2024-03-12 DIAGNOSIS — Z1231 Encounter for screening mammogram for malignant neoplasm of breast: Secondary | ICD-10-CM

## 2024-03-15 ENCOUNTER — Telehealth: Payer: Self-pay

## 2024-03-15 NOTE — Telephone Encounter (Signed)
 Contacted patient and explained that we are waiting for the radiologist to read the  images and file their report. Patient verbalized understanding.

## 2024-03-15 NOTE — Telephone Encounter (Signed)
 Copied from CRM #[id number]. Topic: Clinical - Lab/Test Results >> Mar 14, 2024  4:40 PM [place] J wrote: Reason for CRM: Patient would like her imaging results from  03/05/24. She is wondering why nothing had been released yet.

## 2024-03-16 ENCOUNTER — Ambulatory Visit: Payer: Self-pay | Admitting: Family

## 2024-03-16 ENCOUNTER — Encounter: Payer: Self-pay | Admitting: Family Medicine

## 2024-03-16 ENCOUNTER — Ambulatory Visit: Payer: Self-pay

## 2024-03-16 ENCOUNTER — Telehealth: Admitting: Family Medicine

## 2024-03-16 DIAGNOSIS — J029 Acute pharyngitis, unspecified: Secondary | ICD-10-CM

## 2024-03-16 LAB — VERITOR FLU A/B WAIVED
Influenza A: NEGATIVE
Influenza B: NEGATIVE

## 2024-03-16 LAB — RAPID STREP SCREEN (MED CTR MEBANE ONLY): Strep Gp A Ag, IA W/Reflex: NEGATIVE

## 2024-03-16 LAB — CULTURE, GROUP A STREP

## 2024-03-16 NOTE — Progress Notes (Signed)
 Virtual Visit via MyChart video note  I connected with Alejandra Riley on 03/16/24 at 1346 by video and verified that I am speaking with the correct person using two identifiers. Alejandra Riley is currently located at car and husband are currently with her during visit. The provider, Fonda LABOR Haylynn Pha, MD is located in their office at time of visit.  Call ended at 1355  I discussed the limitations, risks, security and privacy concerns of performing an evaluation and management service by video and the availability of in person appointments. I also discussed with the patient that there may be a patient responsible charge related to this service. The patient expressed understanding and agreed to proceed.   History and Present Illness:  Discussed the use of AI scribe software for clinical note transcription with the patient, who gave verbal consent to proceed.  History of Present Illness   Alejandra Riley is a 53 year old female who presents with sore throat, fever, and reflux symptoms. She is accompanied by her spouse.  She has been experiencing a sore throat and fever for the past four to five days. She reports that her throat appears red but she does not see any pus pockets or white spots.  She has increased acid reflux symptoms and is currently taking omeprazole . She also takes diclofenac , which she associates with more heartburn.  She reports a fever but does not specify the exact temperature.  She has not been vaccinated for COVID-19 but has had the infection previously.         Outpatient Encounter Medications as of 03/16/2024  Medication Sig   Atogepant  (QULIPTA ) 10 MG TABS Take 10 mg by mouth daily.   diclofenac  (VOLTAREN ) 75 MG EC tablet Take 1 tablet (75 mg total) by mouth 2 (two) times daily.   lisinopril  (ZESTRIL ) 2.5 MG tablet Take 1 tablet (2.5 mg total) by mouth daily.   methocarbamol  (ROBAXIN ) 500 MG tablet Take 1 tablet (500 mg total) by mouth every 8  (eight) hours as needed for muscle spasms.   MOUNJARO  2.5 MG/0.5ML Pen SMARTSIG:2.5 Milligram(s) SUB-Q Once a Week   omeprazole  (PRILOSEC) 40 MG capsule Take 40 mg by mouth daily.   pantoprazole  (PROTONIX ) 40 MG tablet Take 1 tablet (40 mg total) by mouth daily.   potassium chloride  (KLOR-CON ) 10 MEQ tablet Take 3 tablets (30 mEq total) by mouth daily.   rosuvastatin  (CRESTOR ) 10 MG tablet Take 1 tablet (10 mg total) by mouth daily.   No facility-administered encounter medications on file as of 03/16/2024.    Review of Systems  Constitutional:  Negative for chills and fever.  HENT:  Positive for congestion and sore throat. Negative for ear discharge, ear pain, rhinorrhea and sinus pain.   Eyes:  Negative for redness and visual disturbance.  Respiratory:  Negative for cough, chest tightness, shortness of breath and wheezing.   Cardiovascular:  Negative for chest pain and leg swelling.  Genitourinary:  Negative for difficulty urinating and dysuria.  Musculoskeletal:  Negative for back pain and gait problem.  Skin:  Negative for rash.  Neurological:  Negative for dizziness, light-headedness and headaches.  Psychiatric/Behavioral:  Negative for agitation and behavioral problems.   All other systems reviewed and are negative.   Observations/Objective: Patient is comfortable and in no acute distress  Assessment and Plan: Problem List Items Addressed This Visit   None Visit Diagnoses       Pharyngitis, unspecified etiology    -  Primary   Relevant Orders  Rapid Strep Screen ([facility] ONLY)   Veritor Flu A/B Waived   Novel Coronavirus, NAA ([facility])          Sore throat with fever and chills, possible infection Sore throat with fever and chills for four to five days suggests possible infection. Differential includes strep throat, influenza, and COVID-19. Throat redness noted. Unvaccinated for COVID-19 but previous infection suggests potential antibodies. - Order throat swab  for strep, influenza, and COVID-19. - Advise immediate swabbing to expedite results. - Recommend home rapid COVID test if available and report results.  Gastroesophageal reflux disease (GERD) Increased acid reflux potentially exacerbated by diclofenac . Omeprazole  used for management. - Advise doubling omeprazole  dose for two weeks, taking one with breakfast and one at lunchtime.         Follow up plan: Return if symptoms worsen or fail to improve.     I discussed the assessment and treatment plan with the patient. The patient was provided an opportunity to ask questions and all were answered. The patient agreed with the plan and demonstrated an understanding of the instructions.   The patient was advised to call back or seek an in-person evaluation if the symptoms worsen or if the condition fails to improve as anticipated.  The above assessment and management plan was discussed with the patient. The patient verbalized understanding of and has agreed to the management plan. Patient is aware to call the clinic if symptoms persist or worsen. Patient is aware when to return to the clinic for a follow-up visit. Patient educated on when it is appropriate to go to the emergency department.    I provided 9 minutes of non-face-to-face time during this encounter.    Fonda DELENA Levins, MD

## 2024-03-16 NOTE — Telephone Encounter (Signed)
 noted

## 2024-03-16 NOTE — Telephone Encounter (Signed)
 FYI Only or Action Required?: FYI only for provider.  Patient was last seen in primary care on 03/05/2024 by Lavell Bari LABOR, FNP.  Called Nurse Triage reporting Sore Throat. Possible LGF  Symptoms began yesterday.  Interventions attempted: OTC medications: IBU.  Symptoms are: unchanged.  Triage Disposition: See Physician Within 24 Hours  Patient/caregiver understands and will follow disposition?: Yes                      Copied from CRM #[id number]. Topic: Clinical - Red Word Triage >> Mar 16, 2024  8:43 AM [place] B wrote: [facility] that prompted transfer to Nurse Triage: Patient has severe pain in throat Reason for Disposition  SEVERE throat pain (e.g., excruciating)  Answer Assessment - Initial Assessment Questions 1. ONSET: When did the throat start hurting? (Hours or days ago)      Nilsa - possible day before 2. SEVERITY: How bad is the sore throat? (Scale 1-10; mild, moderate or severe)     8/10 3. STREP EXPOSURE: Has there been any exposure to strep within the past week? If Yes, ask: What type of contact occurred?      Not that she is aware of 4.  VIRAL SYMPTOMS: Are there any symptoms of a cold, such as a runny nose, cough, hoarse voice or red eyes?      cough 5. FEVER: Do you have a fever? If Yes, ask: What is your temperature, how was it measured, and when did it start?     Off and on 6. PUS ON THE TONSILS: Is there pus on the tonsils in the back of your throat?     unknown 7. OTHER SYMPTOMS: Do you have any other symptoms? (e.g., difficulty breathing, headache, rash)     no  Protocols used: Sore Throat-A-AH

## 2024-03-18 LAB — NOVEL CORONAVIRUS, NAA: SARS-CoV-2, NAA: NOT DETECTED

## 2024-03-19 ENCOUNTER — Ambulatory Visit: Payer: Self-pay | Admitting: Family Medicine

## 2024-03-21 ENCOUNTER — Ambulatory Visit
Admission: RE | Admit: 2024-03-21 | Discharge: 2024-03-21 | Disposition: A | Discharge status: Home / Self Care | Source: Ambulatory Visit | Attending: Family Medicine | Admitting: Family Medicine

## 2024-03-21 DIAGNOSIS — Z1231 Encounter for screening mammogram for malignant neoplasm of breast: Secondary | ICD-10-CM

## 2024-03-24 ENCOUNTER — Other Ambulatory Visit: Payer: Self-pay | Admitting: Family Medicine

## 2024-03-24 DIAGNOSIS — K219 Gastro-esophageal reflux disease without esophagitis: Secondary | ICD-10-CM

## 2024-05-08 ENCOUNTER — Ambulatory Visit: Admitting: Family Medicine

## 2024-05-08 ENCOUNTER — Ambulatory Visit: Payer: Self-pay | Admitting: Family Medicine

## 2024-05-08 VITALS — BP 116/74 | HR 61 | Temp 97.6°F | Ht 66.0 in | Wt 148.6 lb

## 2024-05-08 DIAGNOSIS — E785 Hyperlipidemia, unspecified: Secondary | ICD-10-CM

## 2024-05-08 DIAGNOSIS — E1165 Type 2 diabetes mellitus with hyperglycemia: Secondary | ICD-10-CM

## 2024-05-08 DIAGNOSIS — R110 Nausea: Secondary | ICD-10-CM

## 2024-05-08 DIAGNOSIS — Z720 Tobacco use: Secondary | ICD-10-CM

## 2024-05-08 DIAGNOSIS — E1169 Type 2 diabetes mellitus with other specified complication: Secondary | ICD-10-CM

## 2024-05-08 DIAGNOSIS — R079 Chest pain, unspecified: Secondary | ICD-10-CM

## 2024-05-08 DIAGNOSIS — K219 Gastro-esophageal reflux disease without esophagitis: Secondary | ICD-10-CM | POA: Diagnosis not present

## 2024-05-08 NOTE — Patient Instructions (Signed)

## 2024-05-08 NOTE — Progress Notes (Signed)
 Subjective:  Patient ID: Alejandra Riley, female    DOB: October 30, 1970, 53 y.o.   MRN: [id number]  Patient Care Team: Joesph Annabella HERO, FNP as PCP - General (Family Medicine)   Chief Complaint:  Chest Pain (Patient states she had on and off chest pain Friday an Saturday.  States she has no chest pain now) and Nausea (X 1 month daily )   HPI: Alejandra Riley is a 53 y.o. female presenting on 05/08/2024 for Chest Pain (Patient states she had on and off chest pain Friday an Saturday.  States she has no chest pain now) and Nausea (X 1 month daily )   Alejandra Riley is a 53 year old female with diabetes, hypertension, and high cholesterol who presents with chest pain.  She experienced sharp chest pain on Friday and Saturday, located in the left center of her chest. The pain did not radiate to her jaw, neck, or arm and was not accompanied by sweating or significant shortness of breath, though she did experience some shortness of breath. The pain lasted a few minutes and resolved on its own without any specific triggers or relief measures. She did not seek hospital care as the pain was not severe.  She has been experiencing daily nausea for the past month, which she associates with starting Mounjaro . She denies vomiting but notes a slight weight loss from 160 to 148 pounds, which she attributes to the medication. Her eating habits include irregular meals and insufficient water intake.  She has a history of acid reflux, which she manages with Pepto-Bismol, finding it effective for reflux symptoms but has not used it for chest pain.  Her social history includes smoking and a recent change in her work environment from home-based to more physically active, which she notes has led to some swelling in her feet.          Relevant past medical, surgical, family, and social history reviewed and updated as indicated.  Allergies and medications reviewed and updated. Data reviewed: Chart in  Epic.   Past Medical History:  Diagnosis Date   Anxiety    Diabetes mellitus without complication (HCC)    High cholesterol    Left breast mass 09/20/2017   Benign   Migraines    Prediabetes 05/21/2021    Past Surgical History:  Procedure Laterality Date   PAROTIDECTOMY Right 03/02/2023   Procedure: PAROTIDECTOMY;  Surgeon: Carlie Clark, MD;  Location: [facility] OR;  Service: ENT;  Laterality: Right;   TUBAL LIGATION  1999    Social History   Socioeconomic History   Marital status: Significant Other    Spouse name: Charles   Number of children: 3   Years of education: Not on file   Highest education level: 11th grade  Occupational History   Not on file  Tobacco Use   Smoking status: Every Day    Current packs/day: 1.50    Average packs/day: 1.5 packs/day for 13.8 years (20.7 ttl pk-yrs)    Types: Cigarettes    Start date: 2012   Smokeless tobacco: Never  Vaping Use   Vaping status: Never Used  Substance and Sexual Activity   Alcohol use: Not Currently   Drug use: Never   Sexual activity: Not on file  Other Topics Concern   Not on file  Social History Narrative   Lives at home with fiance and youngest son    Social Drivers of Health   Financial Resource Strain: Medium Risk (05/08/2024)  Overall Financial Resource Strain (CARDIA)    Difficulty of Paying Living Expenses: Somewhat hard  Food Insecurity: No Food Insecurity (05/08/2024)   Hunger Vital Sign    Worried About Running Out of Food in the Last Year: Never true    Ran Out of Food in the Last Year: Never true  Recent Concern: Food Insecurity - Food Insecurity Present (03/05/2024)   Hunger Vital Sign    Worried About Running Out of Food in the Last Year: Never true    Ran Out of Food in the Last Year: Sometimes true  Transportation Needs: No Transportation Needs (05/08/2024)   PRAPARE - Administrator, Civil Service (Medical): No    Lack of Transportation (Non-Medical): No  Physical Activity:  Insufficiently Active (05/08/2024)   Exercise Vital Sign    Days of Exercise per Week: 3 days    Minutes of Exercise per Session: 40 min  Stress: Stress Concern Present (05/08/2024)   [employer] of Occupational Health - Occupational Stress Questionnaire    Feeling of Stress: To some extent  Social Connections: Moderately Integrated (05/08/2024)   Social Connection and Isolation Panel    Frequency of Communication with Friends and Family: Three times a week    Frequency of Social Gatherings with Friends and Family: Once a week    Attends Religious Services: 1 to 4 times per year    Active Member of [employer] or Organizations: No    Attends Engineer, structural: Not on file    Marital Status: Living with partner  Recent Concern: Social Connections - Socially Isolated (03/05/2024)   Social Connection and Isolation Panel    Frequency of Communication with Friends and Family: Once a week    Frequency of Social Gatherings with Friends and Family: Once a week    Attends Religious Services: Never    Database administrator or Organizations: No    Attends Engineer, structural: Not on file    Marital Status: Living with partner  Intimate Partner Violence: Not on file    Outpatient Encounter Medications as of 05/08/2024  Medication Sig   Atogepant  (QULIPTA ) 10 MG TABS Take 10 mg by mouth daily.   diclofenac  (VOLTAREN ) 75 MG EC tablet Take 1 tablet (75 mg total) by mouth 2 (two) times daily.   lisinopril  (ZESTRIL ) 2.5 MG tablet Take 1 tablet (2.5 mg total) by mouth daily.   methocarbamol  (ROBAXIN ) 500 MG tablet Take 1 tablet (500 mg total) by mouth every 8 (eight) hours as needed for muscle spasms.   MOUNJARO  2.5 MG/0.5ML Pen SMARTSIG:2.5 Milligram(s) SUB-Q Once a Week   omeprazole  (PRILOSEC) 40 MG capsule Take 1 capsule by mouth once daily   potassium chloride  (KLOR-CON ) 10 MEQ tablet Take 3 tablets (30 mEq total) by mouth daily.   rosuvastatin  (CRESTOR ) 10 MG tablet Take 1  tablet (10 mg total) by mouth daily.   No facility-administered encounter medications on file as of 05/08/2024.    Allergies  Allergen Reactions   Amoxicillin Hives   Imitrex  [Sumatriptan ] Other (See Comments)    Arm weakness   Ozempic  (0.25 Or 0.5 Mg-Dose) [Semaglutide (0.25 Or 0.5mg -Dos)] Other (See Comments)    Significant heartburn   Penicillins Hives and Other (See Comments)    Pertinent ROS per HPI, otherwise unremarkable      Objective:  BP 116/74   Pulse 61   Temp 97.6 F (36.4 C)   Ht 5' 6 (1.676 m)   Wt 148 lb 9.6 oz (  67.4 kg)   LMP 05/20/2017 Comment: tubal ligation  SpO2 100%   BMI 23.98 kg/m    Wt Readings from Last 3 Encounters:  05/08/24 148 lb 9.6 oz (67.4 kg)  03/05/24 148 lb (67.1 kg)  11/09/23 150 lb 3.2 oz (68.1 kg)    Physical Exam Vitals and nursing note reviewed.  Constitutional:      General: She is not in acute distress.    Appearance: Normal appearance. She is well-developed and normal weight. She is not ill-appearing, toxic-appearing or diaphoretic.  HENT:     Head: Normocephalic and atraumatic.     Mouth/Throat:     Mouth: Mucous membranes are moist.  Eyes:     Conjunctiva/sclera: Conjunctivae normal.     Pupils: Pupils are equal, round, and reactive to light.  Cardiovascular:     Rate and Rhythm: Normal rate and regular rhythm.     Pulses: Normal pulses.     Heart sounds: Normal heart sounds.  Pulmonary:     Effort: Pulmonary effort is normal.     Breath sounds: Normal breath sounds.  Musculoskeletal:     Cervical back: Neck supple.     Right lower leg: No edema.     Left lower leg: No edema.  Skin:    General: Skin is warm and dry.     Capillary Refill: Capillary refill takes less than 2 seconds.  Neurological:     General: No focal deficit present.     Mental Status: She is alert and oriented to person, place, and time.  Psychiatric:        Mood and Affect: Mood normal.        Behavior: Behavior normal.        Thought  Content: Thought content normal.        Judgment: Judgment normal.     Results for orders placed or performed in visit on 03/16/24  Rapid Strep Screen ([facility] ONLY)   Collection Time: 03/16/24  2:19 PM   Specimen: Other   Other  Result Value Ref Range   Strep Gp A Ag, IA W/Reflex Negative Negative  Culture, Group A Strep   Collection Time: 03/16/24  2:19 PM   Other  Result Value Ref Range   Strep A Culture CANCELED   Veritor Flu A/B Waived   Collection Time: 03/16/24  2:19 PM  Result Value Ref Range   Influenza A Negative Negative   Influenza B Negative Negative  Novel Coronavirus, NAA ([facility])   Collection Time: 03/16/24  3:06 PM   Specimen: Nasopharyngeal(NP) swabs in vial transport medium  Result Value Ref Range   SARS-CoV-2, NAA Not Detected Not Detected     EKG: SR 62, PR 170 ms, QT 400 ms, incomplete BBB, negative precordial T-Waves, no acute ST-T changes, no significant changes from prior EKG. Rosaline Bruns, FNP-C  Pertinent labs & imaging results that were available during my care of the patient were reviewed by me and considered in my medical decision making.  Assessment & Plan:  Alejandra Riley was seen today for chest pain and nausea.  Diagnoses and all orders for this visit:  Chest pain in adult -     EKG 12-Lead -     CMP14+EGFR -     CBC with Differential/Platelet -     Ambulatory referral to Cardiology  Nausea -     EKG 12-Lead -     CMP14+EGFR -     CBC with Differential/Platelet  Gastroesophageal reflux disease  without esophagitis -     CMP14+EGFR -     CBC with Differential/Platelet  Hyperlipidemia associated with type 2 diabetes mellitus (HCC) -     CMP14+EGFR -     Ambulatory referral to Cardiology  Type 2 diabetes mellitus with hyperglycemia, without long-term current use of insulin  (HCC) -     CMP14+EGFR -     Ambulatory referral to Cardiology  Tobacco use -     CBC with Differential/Platelet -     Ambulatory referral to  Cardiology     Assessment and Plan    Chest pain, resolved Intermittent chest pain experienced on Friday and Saturday, described as sharp and annoying, located left center, with associated shortness of breath. No radiation to jaw, neck, or arm. No acute changes on EKG, but some changes suggestive of past events. Risk factors include diabetes, smoking, hypertension, and hyperlipidemia. - Order EKG - Refer to cardiology for further evaluation, including possible stress test and echocardiogram - Educate on red flags for chest pain in females, including increased fatigue, shortness of breath, nausea, vomiting, sweatiness, and chest pain radiating to jaw, neck, or arm  Nausea likely secondary to medication Nausea occurring daily for the past month, likely secondary to Mounjaro , which is known to cause nausea by slowing gastric emptying. No vomiting, weight loss, or other contributing factors identified. Inadequate water intake and irregular eating habits may exacerbate symptoms. - Provide tips to prevent nausea, including eating small frequent meals, avoiding carbonated beverages, avoiding heavy foods, and increasing water intake  Gastroesophageal reflux disease Reflux symptoms exacerbated by Mounjaro . Pepto-Bismol provides relief for reflux symptoms.  Type 2 diabetes mellitus Type 2 diabetes mellitus.  Hypertension Hypertension.  Hyperlipidemia Hyperlipidemia.  Nicotine  dependence Current smoker.          Continue all other maintenance medications.  Follow up plan: Return for 4-6 weeks with PCP as scheduled .   Continue healthy lifestyle choices, including diet (rich in fruits, vegetables, and lean proteins, and low in salt and simple carbohydrates) and exercise (at least 30 minutes of moderate physical activity daily).  Educational handout given for GLP-1 success  The above assessment and management plan was discussed with the patient. The patient verbalized understanding of  and has agreed to the management plan. Patient is aware to call the clinic if they develop any new symptoms or if symptoms persist or worsen. Patient is aware when to return to the clinic for a follow-up visit. Patient educated on when it is appropriate to go to the emergency department.   Rosaline Bruns, FNP-C [facility] [phone]

## 2024-05-09 LAB — CMP14+EGFR
ALT: 9 IU/L (ref 0–32)
AST: 13 IU/L (ref 0–40)
Albumin: 4.3 g/dL (ref 3.8–4.9)
Alkaline Phosphatase: 100 IU/L (ref 49–135)
BUN/Creatinine Ratio: 6 — AB (ref 9–23)
BUN: 5 mg/dL — AB (ref 6–24)
Bilirubin Total: 0.4 mg/dL (ref 0.0–1.2)
CO2: 24 mmol/L (ref 20–29)
Calcium: 10 mg/dL (ref 8.7–10.2)
Chloride: 101 mmol/L (ref 96–106)
Creatinine, Ser: 0.79 mg/dL (ref 0.57–1.00)
Globulin, Total: 2.5 g/dL (ref 1.5–4.5)
Glucose: 84 mg/dL (ref 70–99)
Potassium: 3.7 mmol/L (ref 3.5–5.2)
Sodium: 139 mmol/L (ref 134–144)
Total Protein: 6.8 g/dL (ref 6.0–8.5)
eGFR: 90 mL/min/1.73 (ref >59)

## 2024-05-09 LAB — CBC WITH DIFFERENTIAL/PLATELET
Basophils Absolute: 0.1 x10E3/uL (ref 0.0–0.2)
Basos: 1 %
EOS (ABSOLUTE): 0.2 x10E3/uL (ref 0.0–0.4)
Eos: 2 %
Hematocrit: 40.5 % (ref 34.0–46.6)
Hemoglobin: 13 g/dL (ref 11.1–15.9)
Immature Grans (Abs): 0 x10E3/uL (ref 0.0–0.1)
Immature Granulocytes: 0 %
Lymphocytes Absolute: 3 x10E3/uL (ref 0.7–3.1)
Lymphs: 34 %
MCH: 27.8 pg (ref 26.6–33.0)
MCHC: 32.1 g/dL (ref 31.5–35.7)
MCV: 87 fL (ref 79–97)
Monocytes Absolute: 0.7 x10E3/uL (ref 0.1–0.9)
Monocytes: 9 %
Neutrophils Absolute: 4.6 x10E3/uL (ref 1.4–7.0)
Neutrophils: 54 %
Platelets: 347 x10E3/uL (ref 150–450)
RBC: 4.67 x10E6/uL (ref 3.77–5.28)
RDW: 13.6 % (ref 11.7–15.4)
WBC: 8.6 x10E3/uL (ref 3.4–10.8)

## 2024-05-16 ENCOUNTER — Encounter: Payer: Self-pay | Admitting: Family Medicine

## 2024-05-16 ENCOUNTER — Ambulatory Visit: Admitting: Family Medicine

## 2024-05-16 VITALS — BP 113/72 | HR 66 | Temp 97.8°F | Ht 66.0 in | Wt 149.4 lb

## 2024-05-16 DIAGNOSIS — Z0001 Encounter for general adult medical examination with abnormal findings: Secondary | ICD-10-CM | POA: Diagnosis not present

## 2024-05-16 DIAGNOSIS — E1169 Type 2 diabetes mellitus with other specified complication: Secondary | ICD-10-CM | POA: Diagnosis not present

## 2024-05-16 DIAGNOSIS — E1165 Type 2 diabetes mellitus with hyperglycemia: Secondary | ICD-10-CM

## 2024-05-16 DIAGNOSIS — E876 Hypokalemia: Secondary | ICD-10-CM

## 2024-05-16 DIAGNOSIS — Z7985 Long-term (current) use of injectable non-insulin antidiabetic drugs: Secondary | ICD-10-CM

## 2024-05-16 DIAGNOSIS — F339 Major depressive disorder, recurrent, unspecified: Secondary | ICD-10-CM | POA: Diagnosis not present

## 2024-05-16 DIAGNOSIS — L989 Disorder of the skin and subcutaneous tissue, unspecified: Secondary | ICD-10-CM

## 2024-05-16 DIAGNOSIS — Z122 Encounter for screening for malignant neoplasm of respiratory organs: Secondary | ICD-10-CM

## 2024-05-16 DIAGNOSIS — Z0000 Encounter for general adult medical examination without abnormal findings: Secondary | ICD-10-CM

## 2024-05-16 DIAGNOSIS — K219 Gastro-esophageal reflux disease without esophagitis: Secondary | ICD-10-CM | POA: Diagnosis not present

## 2024-05-16 DIAGNOSIS — E785 Hyperlipidemia, unspecified: Secondary | ICD-10-CM

## 2024-05-16 DIAGNOSIS — F411 Generalized anxiety disorder: Secondary | ICD-10-CM

## 2024-05-16 DIAGNOSIS — Z13228 Encounter for screening for other metabolic disorders: Secondary | ICD-10-CM

## 2024-05-16 LAB — BAYER DCA HB A1C WAIVED: HB A1C (BAYER DCA - WAIVED): 5.1 % (ref 4.8–5.6)

## 2024-05-16 MED ORDER — PANTOPRAZOLE SODIUM 40 MG PO TBEC: 40.0000 mg | DELAYED_RELEASE_TABLET | Freq: Two times a day (BID) | ORAL | 3 refills | Status: AC

## 2024-05-16 MED ORDER — POTASSIUM CHLORIDE ER 10 MEQ PO TBCR: 30.0000 meq | EXTENDED_RELEASE_TABLET | Freq: Every day | ORAL | 1 refills | Status: AC

## 2024-05-16 MED ORDER — ESCITALOPRAM OXALATE 10 MG PO TABS
10.0000 mg | ORAL_TABLET | Freq: Every day | ORAL | 3 refills | Status: AC
Start: 2024-05-16 — End: ?

## 2024-05-16 NOTE — Patient Instructions (Signed)

## 2024-05-16 NOTE — Progress Notes (Signed)
 Complete physical exam  Patient: Alejandra Riley   DOB: 1971-01-11   53 y.o. Female  MRN: [id number]  Subjective:    Chief Complaint  Patient presents with   Annual Exam    Alejandra Riley is a 53 y.o. female who presents today for a complete physical exam. She reports consuming a general diet. The patient does not participate in regular exercise at present. She generally feels fairly well. She reports sleeping poorly. She does have additional problems to discuss today.   Depressive and anxiety symptoms - Depressed mood and anxiety present for an extended period - Poor sleep quality - Lack of motivation - Inability to engage in activities outside of work - No current use of psychiatric medications - Family history of similar symptoms; mother had a 'really bad nerve condition' and took klonopin for sleep and anxiety  Gastroesophageal reflux symptoms - Ongoing episodes of acid reflux - Sour stomach and bad taste upon burping, occurring twice recently - Inadequate symptom relief with Protonix  and omeprazole  - Pepto-Bismol provides temporary relief during severe episodes  Cutaneous lesion  - Bump on back present for approximately six months - Initially appeared cystic, was painful and pruritic - Currently less prominent, primarily pruritic      Most recent fall risk assessment:    05/16/2024    8:12 AM  Fall Risk   Falls in the past year? 1  Number falls in past yr: 1  Injury with Fall? 0  Risk for fall due to : History of fall(s)  Follow up Falls evaluation completed     Most recent depression screenings:    05/16/2024    8:13 AM 05/08/2024    3:25 PM 03/05/2024    3:12 PM  Depression screen PHQ 2/9  Decreased Interest 3 1 2   Down, Depressed, Hopeless 2 1 1   PHQ - 2 Score 5 2 3   Altered sleeping 3 2 3   Tired, decreased energy 3 3 3   Change in appetite 2 1 3   Feeling bad or failure about yourself  1 0 1  Trouble concentrating 2 2 2   Moving slowly or  fidgety/restless 1 0 0  Suicidal thoughts 0 0 0  PHQ-9 Score 17 10 15   Difficult doing work/chores Somewhat difficult Somewhat difficult Somewhat difficult      05/16/2024    8:14 AM 05/08/2024    3:25 PM 03/05/2024    3:12 PM 08/10/2023    4:25 PM  GAD 7 : Generalized Anxiety Score  Nervous, Anxious, on Edge 1 2 2 1   Control/stop worrying 2 2 2 1   Worry too much - different things 2 2 2 1   Trouble relaxing 3 2 3 1   Restless 1 0 1 0  Easily annoyed or irritable 3 0 3 1  Afraid - awful might happen 1 0 0 0  Total GAD 7 Score 13 8 13 5   Anxiety Difficulty Somewhat difficult Not difficult at all Somewhat difficult Somewhat difficult          Patient Care Team: Joesph Annabella HERO, FNP as PCP - General (Family Medicine)   Outpatient Medications Prior to Visit  Medication Sig   Atogepant  (QULIPTA ) 10 MG TABS Take 10 mg by mouth daily.   diclofenac  (VOLTAREN ) 75 MG EC tablet Take 1 tablet (75 mg total) by mouth 2 (two) times daily.   lisinopril  (ZESTRIL ) 2.5 MG tablet Take 1 tablet (2.5 mg total) by mouth daily.   methocarbamol  (ROBAXIN ) 500 MG tablet Take 1 tablet (500  mg total) by mouth every 8 (eight) hours as needed for muscle spasms.   MOUNJARO  2.5 MG/0.5ML Pen SMARTSIG:2.5 Milligram(s) SUB-Q Once a Week   omeprazole  (PRILOSEC) 40 MG capsule Take 1 capsule by mouth once daily   potassium chloride  (KLOR-CON ) 10 MEQ tablet Take 3 tablets (30 mEq total) by mouth daily.   rosuvastatin  (CRESTOR ) 10 MG tablet Take 1 tablet (10 mg total) by mouth daily.   No facility-administered medications prior to visit.    ROS As per HPI.        Objective:     BP 113/72   Pulse 66   Temp 97.8 F (36.6 C) (Temporal)   Ht 5' 6 (1.676 m)   Wt 149 lb 6.4 oz (67.8 kg)   LMP 05/20/2017 Comment: tubal ligation  SpO2 100%   BMI 24.11 kg/m  Wt Readings from Last 3 Encounters:  05/16/24 149 lb 6.4 oz (67.8 kg)  05/08/24 148 lb 9.6 oz (67.4 kg)  03/05/24 148 lb (67.1 kg)       Physical Exam Vitals and nursing note reviewed.  Constitutional:      General: She is not in acute distress.    Appearance: Normal appearance. She is not ill-appearing, toxic-appearing or diaphoretic.  HENT:     Head: Normocephalic.     Right Ear: Tympanic membrane, ear canal and external ear normal.     Left Ear: Tympanic membrane, ear canal and external ear normal.     Nose: Nose normal.     Mouth/Throat:     Mouth: Mucous membranes are moist.     Pharynx: Oropharynx is clear.  Eyes:     Extraocular Movements: Extraocular movements intact.     Conjunctiva/sclera: Conjunctivae normal.     Pupils: Pupils are equal, round, and reactive to light.  Cardiovascular:     Rate and Rhythm: Normal rate and regular rhythm.     Pulses: Normal pulses.     Heart sounds: Normal heart sounds. No murmur heard.    No friction rub. No gallop.  Pulmonary:     Effort: Pulmonary effort is normal.     Breath sounds: Normal breath sounds.  Abdominal:     General: Bowel sounds are normal. There is no distension.     Palpations: Abdomen is soft. There is no mass.     Tenderness: There is no abdominal tenderness. There is no guarding.  Musculoskeletal:        General: No tenderness.     Cervical back: Normal range of motion and neck supple. No tenderness.     Right lower leg: No edema.     Left lower leg: No edema.  Skin:    General: Skin is warm and dry.     Capillary Refill: Capillary refill takes less than 2 seconds.     Findings: Lesion (mid upper back. Pictured below) present. No rash.  Neurological:     General: No focal deficit present.     Mental Status: She is alert and oriented to person, place, and time.     Cranial Nerves: No cranial nerve deficit.     Motor: No weakness.     Gait: Gait normal.  Psychiatric:        Mood and Affect: Mood normal.        Behavior: Behavior normal.        Thought Content: Thought content normal.        Judgment: Judgment normal.        No  results found for any visits on 05/16/24.     Assessment & Plan:    Routine Health Maintenance and Physical Exam  [name] was seen today for annual exam.  Diagnoses and all orders for this visit:  Routine general medical examination at a health care facility  Type 2 diabetes mellitus with hyperglycemia, without long-term current use of insulin  (HCC) -     Bayer DCA Hb A1c Waived -     Microalbumin / creatinine urine ratio  Hyperlipidemia associated with type 2 diabetes mellitus (HCC) -     CBC with Differential/Platelet -     CMP14+EGFR -     Lipid panel  Gastroesophageal reflux disease without esophagitis -     pantoprazole  (PROTONIX ) 40 MG tablet; Take 1 tablet (40 mg total) by mouth 2 (two) times daily before a meal.  Depression, recurrent -     escitalopram (LEXAPRO) 10 MG tablet; Take 1 tablet (10 mg total) by mouth daily.  Generalized anxiety disorder -     escitalopram (LEXAPRO) 10 MG tablet; Take 1 tablet (10 mg total) by mouth daily.  Hypokalemia -     potassium chloride  (KLOR-CON ) 10 MEQ tablet; Take 3 tablets (30 mEq total) by mouth daily.  Skin lesion of back -     Ambulatory referral to Dermatology  Screening for lung cancer -     Ambulatory Referral for Lung Cancer Scre  Encounter for screening for other metabolic disorders -     TSH   Assessment and Plan    Depression and anxiety symptoms Not well controlled. Denies SI. No prior medication use. - Start Lexapro 10 mg daily. - Monitor for side effects: headache, nausea, fatigue; adjust dose if necessary. - Follow up in 6 weeks to assess response.  Gastroesophageal reflux disease (GERD) Intermittent daytime symptoms. Previous medications ineffective. Pepto provides temporary relief. - Increase Protonix  to 40 mg twice daily. - Discontinue omeprazole . - Use Pepto as needed for severe symptoms.  Skin lesion of back,  - Refer to dermatologist for evaluation and possible removal.  T2DM Well  controlled.   HLD Continue statin.   Adult Wellness Visit Weight well-managed. Engages in physical activity through work.        There is no immunization history on file for this patient.  Health Maintenance  Topic Date Due   OPHTHALMOLOGY EXAM  Never done   Lung Cancer Screening  Never done   Diabetic kidney evaluation - Urine ACR  01/24/2024   HEMOGLOBIN A1C  05/10/2024   DTaP/Tdap/Td (1 - Tdap) 08/09/2024 (Originally 05/30/1990)   Influenza Vaccine  10/23/2024 (Originally 02/24/2024)   Colonoscopy  11/08/2024 (Originally 05/30/2016)   Cervical Cancer Screening (HPV/Pap Cotest)  05/16/2025 (Originally 10/16/2022)   Pneumococcal Vaccine: 50+ Years (1 of 2 - PCV) 05/16/2025 (Originally 05/30/1990)   Hepatitis B Vaccines 19-59 Average Risk (1 of 3 - 19+ 3-dose series) 05/16/2025 (Originally 05/30/1990)   COVID-19 Vaccine (1 - 2025-26 season) 06/01/2025 (Originally 03/26/2024)   Zoster Vaccines- Shingrix (1 of 2) 08/16/2025 (Originally 05/30/2021)   Mammogram  03/21/2025   Diabetic kidney evaluation - eGFR measurement  05/08/2025   FOOT EXAM  05/16/2025   Hepatitis C Screening  Completed   HIV Screening  Completed   HPV VACCINES  Aged Out   Meningococcal B Vaccine  Aged Out    Discussed health benefits of physical activity, and encouraged her to engage in regular exercise appropriate for her age and condition.  Problem List Items Addressed This Visit  Digestive   Gastroesophageal reflux disease   Relevant Medications   pantoprazole  (PROTONIX ) 40 MG tablet     Endocrine   Hyperlipidemia associated with type 2 diabetes mellitus (HCC)   Relevant Orders   CBC with Differential/Platelet   CMP14+EGFR   Lipid panel   Type 2 diabetes mellitus with hyperglycemia, without long-term current use of insulin  (HCC)   Relevant Orders   Bayer DCA Hb A1c Waived   Microalbumin / creatinine urine ratio     Other   Hypokalemia   Relevant Medications   potassium chloride  (KLOR-CON ) 10  MEQ tablet   Depression, recurrent   Relevant Medications   escitalopram (LEXAPRO) 10 MG tablet   Other Visit Diagnoses       Routine general medical examination at a health care facility    -  Primary     Generalized anxiety disorder       Relevant Medications   escitalopram (LEXAPRO) 10 MG tablet     Skin lesion of back       Relevant Orders   Ambulatory referral to Dermatology     Screening for lung cancer       Relevant Orders   Ambulatory Referral for Lung Cancer Scre     Encounter for screening for other metabolic disorders       Relevant Orders   TSH      Return in about 6 weeks (around 06/27/2024) for medication follow up.   The patient indicates understanding of these issues and agrees with the plan.  Annabella CHRISTELLA Search, FNP

## 2024-05-17 ENCOUNTER — Ambulatory Visit: Payer: Self-pay | Admitting: Family Medicine

## 2024-05-17 LAB — CMP14+EGFR
ALT: 19 IU/L (ref 0–32)
AST: 22 IU/L (ref 0–40)
Albumin: 4.1 g/dL (ref 3.8–4.9)
Alkaline Phosphatase: 115 IU/L (ref 49–135)
BUN/Creatinine Ratio: 8 — ABNORMAL LOW (ref 9–23)
BUN: 6 mg/dL (ref 6–24)
Bilirubin Total: 0.4 mg/dL (ref 0.0–1.2)
CO2: 25 mmol/L (ref 20–29)
Calcium: 9.9 mg/dL (ref 8.7–10.2)
Chloride: 100 mmol/L (ref 96–106)
Creatinine, Ser: 0.79 mg/dL (ref 0.57–1.00)
Globulin, Total: 2.4 g/dL (ref 1.5–4.5)
Glucose: 96 mg/dL (ref 70–99)
Potassium: 3.9 mmol/L (ref 3.5–5.2)
Sodium: 139 mmol/L (ref 134–144)
Total Protein: 6.5 g/dL (ref 6.0–8.5)
eGFR: 90 mL/min/1.73 (ref >59)

## 2024-05-17 LAB — MICROALBUMIN / CREATININE URINE RATIO
Creatinine, Urine: 21.7 mg/dL
Microalb/Creat Ratio: <14 mg/g{creat} (ref 0–29)
Microalbumin, Urine: <3 ug/mL

## 2024-05-17 LAB — CBC WITH DIFFERENTIAL/PLATELET
Basophils Absolute: 0.1 x10E3/uL (ref 0.0–0.2)
Basos: 1 %
EOS (ABSOLUTE): 0.2 x10E3/uL (ref 0.0–0.4)
Eos: 3 %
Hematocrit: 40.9 % (ref 34.0–46.6)
Hemoglobin: 13.3 g/dL (ref 11.1–15.9)
Immature Grans (Abs): 0 x10E3/uL (ref 0.0–0.1)
Immature Granulocytes: 0 %
Lymphocytes Absolute: 2.3 x10E3/uL (ref 0.7–3.1)
Lymphs: 31 %
MCH: 27.9 pg (ref 26.6–33.0)
MCHC: 32.5 g/dL (ref 31.5–35.7)
MCV: 86 fL (ref 79–97)
Monocytes Absolute: 0.6 x10E3/uL (ref 0.1–0.9)
Monocytes: 7 %
Neutrophils Absolute: 4.4 x10E3/uL (ref 1.4–7.0)
Neutrophils: 58 %
Platelets: 320 x10E3/uL (ref 150–450)
RBC: 4.76 x10E6/uL (ref 3.77–5.28)
RDW: 13.5 % (ref 11.7–15.4)
WBC: 7.6 x10E3/uL (ref 3.4–10.8)

## 2024-05-17 LAB — LIPID PANEL
Chol/HDL Ratio: 2.1 ratio (ref 0.0–4.4)
Cholesterol, Total: 133 mg/dL (ref 100–199)
HDL: 63 mg/dL (ref >39)
LDL Chol Calc (NIH): 47 mg/dL (ref 0–99)
Triglycerides: 131 mg/dL (ref 0–149)
VLDL Cholesterol Cal: 23 mg/dL (ref 5–40)

## 2024-05-17 LAB — TSH: TSH: 1.5 u[IU]/mL (ref 0.450–4.500)

## 2024-06-27 ENCOUNTER — Encounter: Payer: Self-pay | Admitting: Family Medicine

## 2024-06-27 ENCOUNTER — Ambulatory Visit: Payer: Self-pay | Admitting: Family Medicine

## 2024-07-02 ENCOUNTER — Encounter: Payer: Self-pay | Admitting: Emergency Medicine

## 2024-08-06 ENCOUNTER — Encounter: Payer: Self-pay | Admitting: Cardiology

## 2024-08-06 DIAGNOSIS — E118 Type 2 diabetes mellitus with unspecified complications: Secondary | ICD-10-CM | POA: Insufficient documentation

## 2024-08-06 DIAGNOSIS — R072 Precordial pain: Secondary | ICD-10-CM | POA: Insufficient documentation

## 2024-08-06 NOTE — Progress Notes (Unsigned)
 "    Cardiology Office Note   Date:  08/08/2024   ID:  Alejandra Riley, DOB 19-Dec-1970, MRN [id number]  PCP:  Joesph Annabella HERO, FNP  Cardiologist:   Lynwood Schilling, MD Referring:  Joesph Annabella HERO, FNP  Chief Complaint  Patient presents with   Chest Pain   Hyperlipidemia      History of Present Illness: Alejandra Riley is a 54 y.o. female who presents for evaluation of chest pain.   She is referred by Joesph Annabella HERO, FNP.  She had this and was seen by her primary provider in October.  She has not had any prior cardiac history but she does have risk factors with diabetes and tobacco abuse.  She said that this discomfort would come on sporadically.  It was in mid discomfort.  It happens at rest or with activity but she cannot bring it on specifically with activity.  She is getting it about every couple of weeks.  It might last for 3 to 4 minutes.  It is 5 out of 10 in intensity.  It is admits sternal discomfort without radiation.  There is no associated nausea vomiting or diaphoresis.  She has some chronic dyspnea with exertion but this has been mild.  She is not having any resting shortness of breath and has no PND or orthopnea.  She has no weight gain or edema.   Past Medical History:  Diagnosis Date   Anxiety    Diabetes mellitus without complication (HCC)    High cholesterol    Left breast mass 09/20/2017   Benign   Migraines     Past Surgical History:  Procedure Laterality Date   PAROTIDECTOMY Right 03/02/2023   Procedure: PAROTIDECTOMY;  Surgeon: Carlie Clark, MD;  Location: [facility] OR;  Service: ENT;  Laterality: Right;   TUBAL LIGATION  1999     Current Outpatient Medications  Medication Sig Dispense Refill   Atogepant  (QULIPTA ) 10 MG TABS Take 10 mg by mouth daily. 90 tablet 3   diclofenac  (VOLTAREN ) 75 MG EC tablet Take 1 tablet (75 mg total) by mouth 2 (two) times daily. 60 tablet 2   escitalopram  (LEXAPRO ) 10 MG tablet Take 1 tablet (10 mg total) by mouth daily. 90  tablet 3   lisinopril  (ZESTRIL ) 2.5 MG tablet Take 1 tablet (2.5 mg total) by mouth daily. 90 tablet 3   methocarbamol  (ROBAXIN ) 500 MG tablet Take 1 tablet (500 mg total) by mouth every 8 (eight) hours as needed for muscle spasms. 30 tablet 3   MOUNJARO  2.5 MG/0.5ML Pen SMARTSIG:2.5 Milligram(s) SUB-Q Once a Week     pantoprazole  (PROTONIX ) 40 MG tablet Take 1 tablet (40 mg total) by mouth 2 (two) times daily before a meal. 180 tablet 3   potassium chloride  (KLOR-CON ) 10 MEQ tablet Take 3 tablets (30 mEq total) by mouth daily. 270 tablet 1   rosuvastatin  (CRESTOR ) 10 MG tablet Take 1 tablet (10 mg total) by mouth daily. 90 tablet 3   No current facility-administered medications for this visit.    Allergies:   Amoxicillin, Imitrex  [sumatriptan ], Ozempic  (0.25 or 0.5 mg-dose) [semaglutide (0.25 or 0.5mg -dos)], and Penicillins    Social History:  The patient  reports that she has been smoking cigarettes. She started smoking about 14 years ago. She has a 21.1 pack-year smoking history. She has never used smokeless tobacco. She reports that she does not currently use alcohol. She reports that she does not use drugs.   Family History:  The patient's family history  includes Breast cancer in her maternal grandmother; Depression in her mother; Diabetes in her maternal grandmother; Heart disease in her maternal grandmother; Hyperlipidemia in her maternal grandmother; Hypertension in her maternal grandmother and mother; Other in her son; Prostate cancer in her father; Stomach cancer in her father; Stroke in her mother; Thyroid  disease in her maternal grandmother; Vision loss in her mother.    ROS:  Please see the history of present illness.   Otherwise, review of systems are positive for none.   All other systems are reviewed and negative.    PHYSICAL EXAM: VS:  BP 123/75 (BP Location: Left Arm, Patient Position: Sitting, Cuff Size: Normal)   Pulse 67   Resp 16   Ht 5' 6 (1.676 m)   Wt 155 lb (70.3  kg)   LMP 05/20/2017 Comment: tubal ligation  SpO2 100%   BMI 25.02 kg/m  , BMI Body mass index is 25.02 kg/m. GENERAL:  Well appearing HEENT:  Pupils equal round and reactive, fundi not visualized, oral mucosa unremarkable NECK:  No jugular venous distention, waveform within normal limits, carotid upstroke brisk and symmetric, no bruits, no thyromegaly LYMPHATICS:  No cervical, inguinal adenopathy LUNGS:  Clear to auscultation bilaterally BACK:  No CVA tenderness CHEST:  Unremarkable HEART:  PMI not displaced or sustained,S1 and S2 within normal limits, no S3, no S4, no clicks, no rubs,  no murmurs ABD:  Flat, positive bowel sounds normal in frequency in pitch, no bruits, no rebound, no guarding, no midline pulsatile mass, no hepatomegaly, no splenomegaly EXT:  2 plus pulses throughout, no edema, no cyanosis no clubbing SKIN:  No rashes no nodules NEURO:  Cranial nerves II through XII grossly intact, motor grossly intact throughout [facility]:  Cognitively intact, oriented to person place and time    EKG:    05/08/2024 NSR, rate 61, axis WNL , intervals WNL, no acute ST T wave changes.  Poor anterior R wave progression.     Recent Labs: 05/16/2024: ALT 19; BUN 6; Creatinine, Ser 0.79; Hemoglobin 13.3; Platelets 320; Potassium 3.9; Sodium 139; TSH 1.500    Lipid Panel    Component Value Date/Time   CHOL 133 05/16/2024 0849   TRIG 131 05/16/2024 0849   HDL 63 05/16/2024 0849   CHOLHDL 2.1 05/16/2024 0849   LDLCALC 47 05/16/2024 0849      Wt Readings from Last 3 Encounters:  08/08/24 155 lb (70.3 kg)  05/16/24 149 lb 6.4 oz (67.8 kg)  05/08/24 148 lb 9.6 oz (67.4 kg)      Other studies Reviewed: Additional studies/ records that were reviewed today include: Labs. Review of the above records demonstrates:  Please see elsewhere in the note.     ASSESSMENT AND PLAN:   Precordial chest pain: Her chest discomfort is nonanginal greater than anginal but she does have  significant risk factors.  I am going to screen her with a POET (Plain Old Exercise Treadmill).    DM: A1c is 5.1.  No change in therapy.  Tobacco abuse: We talked about the need to stop smoking.  She does not think she can but she will try to cut back.  Dyslipidemia: LDL is 47 with an HDL of 63.  No change in therapy.  Current medicines are reviewed at length with the patient today.  The patient does not have concerns regarding medicines.  The following changes have been made:  no change  Labs/ tests ordered today include:   Orders Placed This Encounter  Procedures  EXERCISE TOLERANCE TEST (ETT)     Disposition:   FU with with me based on the results of the above or if she continues to have symptoms.   Signed, Lynwood Schilling, MD  08/08/2024 3:52 PM    [facility]    "

## 2024-08-08 ENCOUNTER — Telehealth: Payer: Self-pay

## 2024-08-08 ENCOUNTER — Encounter: Payer: Self-pay | Admitting: Cardiology

## 2024-08-08 ENCOUNTER — Ambulatory Visit: Admitting: Cardiology

## 2024-08-08 ENCOUNTER — Encounter: Payer: Self-pay | Admitting: *Deleted

## 2024-08-08 VITALS — BP 123/75 | HR 67 | Resp 16 | Ht 66.0 in | Wt 155.0 lb

## 2024-08-08 DIAGNOSIS — E118 Type 2 diabetes mellitus with unspecified complications: Secondary | ICD-10-CM | POA: Diagnosis not present

## 2024-08-08 DIAGNOSIS — R072 Precordial pain: Secondary | ICD-10-CM

## 2024-08-08 NOTE — Telephone Encounter (Signed)
 Copied from CRM [id number]. Topic: Clinical - Order For Equipment >> Aug 08, 2024 11:53 AM Victoria B wrote: Reason for CRM: [place] from  [facility] faxed over reuest for blood gluocose moniror on jan 7 and is checking the status

## 2024-08-08 NOTE — Patient Instructions (Addendum)
Medication Instructions:   Your physician recommends that you continue on your current medications as directed. Please refer to the Current Medication list given to you today.  Labwork:  none  Testing/Procedures: Your physician has requested that you have an exercise tolerance test. For further information please visit [email or web address]. Please also follow instruction sheet, as given.  Follow-Up:  Your physician recommends that you schedule a follow-up appointment in: as needed.  Any Other Special Instructions Will Be Listed Below (If Applicable).  If you need a refill on your cardiac medications before your next appointment, please call your pharmacy. 

## 2024-08-08 NOTE — Telephone Encounter (Signed)
 Contacted patient and verified that patient did indeed request medical device [facility]  [employer]..   I contacted [facility]  [employer] and asked them to refax order and record request.

## 2024-08-15 ENCOUNTER — Other Ambulatory Visit (HOSPITAL_COMMUNITY): Payer: Self-pay

## 2024-08-20 ENCOUNTER — Other Ambulatory Visit (HOSPITAL_COMMUNITY): Payer: Self-pay

## 2024-08-20 ENCOUNTER — Telehealth: Payer: Self-pay | Admitting: Pharmacy Technician

## 2024-08-20 NOTE — Telephone Encounter (Signed)
 Pharmacy Patient Advocate Encounter   Received notification from [employer] KEY that prior authorization for Mounjaro  2.5MG /0.5ML auto-injectors is due for renewal.   Insurance verification completed.   The patient is insured through [employer].  Action: PA required; PA submitted to above mentioned insurance via Latent Key/confirmation #/EOC [id number] Status is pending

## 2024-08-24 ENCOUNTER — Other Ambulatory Visit (HOSPITAL_COMMUNITY): Payer: Self-pay

## 2024-08-24 ENCOUNTER — Ambulatory Visit (HOSPITAL_COMMUNITY): Admission: RE | Admit: 2024-08-24 | Source: Ambulatory Visit

## 2024-08-24 NOTE — Telephone Encounter (Signed)
 Pharmacy Patient Advocate Encounter  Received notification from [employer] that Prior Authorization for  Mounjaro  2.5MG /0.5ML auto-injectors has been closed.   PA #/Case ID/Reference #: [id number]  Health plan closed this requested because she already has an approved prior authorization on file effective 07/15/024 with no end date. They did state that she has to fill the medication at [facility] and that she should be aware of this.  No further PA needed at this time.

## 2025-05-17 ENCOUNTER — Encounter: Payer: Self-pay | Admitting: Family Medicine
